# Patient Record
Sex: Male | Born: 2000 | Race: White | Hispanic: No | Marital: Married | Smoking: Never smoker
Health system: Southern US, Community
[De-identification: ages and names within clinical notes are randomized; demographics above are authoritative.]

## PROBLEM LIST (undated history)

## (undated) DIAGNOSIS — R1013 Epigastric pain: Secondary | ICD-10-CM

## (undated) DIAGNOSIS — H539 Unspecified visual disturbance: Secondary | ICD-10-CM

## (undated) DIAGNOSIS — F329 Major depressive disorder, single episode, unspecified: Secondary | ICD-10-CM

## (undated) DIAGNOSIS — J45909 Unspecified asthma, uncomplicated: Secondary | ICD-10-CM

## (undated) DIAGNOSIS — T7840XA Allergy, unspecified, initial encounter: Secondary | ICD-10-CM

## (undated) DIAGNOSIS — F32A Depression, unspecified: Secondary | ICD-10-CM

## (undated) DIAGNOSIS — F419 Anxiety disorder, unspecified: Secondary | ICD-10-CM

## (undated) DIAGNOSIS — K219 Gastro-esophageal reflux disease without esophagitis: Secondary | ICD-10-CM

## (undated) DIAGNOSIS — R109 Unspecified abdominal pain: Secondary | ICD-10-CM

## (undated) HISTORY — DX: Epigastric pain: R10.13

## (undated) HISTORY — PX: CHOLECYSTECTOMY: SHX55

## (undated) HISTORY — DX: Unspecified abdominal pain: R10.9

---
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
## 2020-05-17 ENCOUNTER — Ambulatory Visit (INDEPENDENT_AMBULATORY_CARE_PROVIDER_SITE_OTHER): Payer: Self-pay | Admitting: Pediatrics

## 2020-05-17 ENCOUNTER — Other Ambulatory Visit: Payer: Self-pay

## 2020-05-17 ENCOUNTER — Encounter: Payer: Self-pay | Admitting: Pediatrics

## 2020-05-17 VITALS — Wt 221.1 lb

## 2020-05-17 DIAGNOSIS — S40022A Contusion of left upper arm, initial encounter: Secondary | ICD-10-CM

## 2020-05-17 DIAGNOSIS — K219 Gastro-esophageal reflux disease without esophagitis: Secondary | ICD-10-CM

## 2020-05-17 MED ORDER — FAMOTIDINE 20 MG PO TABS
20.0000 mg | ORAL_TABLET | Freq: Every day | ORAL | 1 refills | Status: DC
Start: 2020-05-17 — End: 2024-02-14

## 2020-05-17 NOTE — Progress Notes (Signed)
Subjective:     Craig Pearson is a 20 y.o. male who presents for evaluation of a large bruise on the left forearm with a "knot" in the center and reflux. The bruise started approximately 1 week ago with little improvement. 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 He denies any numbness or tingling in his hand. He has also noticed that his reflux is getting worse, especially when he lays down after eating. He describes it as a burning pain in the back of his throat. He does drink soda daily and eats some spicy foods.  The following portions of the patient's history were reviewed and updated as appropriate: allergies, current medications, past family history, past medical history, past social history, past surgical history and problem list.  Review of Systems Pertinent items are noted in HPI.    Objective:    Wt 221 lb 1.6 oz (100.3 kg)   BMI 31.28 kg/m  General:  alert, cooperative, appears stated age and no distress  Skin:  hematomas noted on left foreamr     Assessment:    Hematoma of left forearm GERD  Plan:    Medications: Famatodine. Verbal and written  patient instruction given.   Follow up as needed

## 2020-05-17 NOTE — Patient Instructions (Signed)
Famotidine - 1 tablet daily for 14 days and then as needed Cool compress to bruise on arm Follow up as needed

## 2020-05-27 ENCOUNTER — Emergency Department (HOSPITAL_COMMUNITY)
Admission: EM | Admit: 2020-05-27 | Discharge: 2020-05-28 | Disposition: A | Discharge status: Home / Self Care | Payer: Medicaid Other | Attending: Emergency Medicine | Admitting: Emergency Medicine

## 2020-05-27 ENCOUNTER — Other Ambulatory Visit: Payer: Self-pay

## 2020-05-27 ENCOUNTER — Encounter (HOSPITAL_COMMUNITY): Payer: Self-pay | Admitting: Emergency Medicine

## 2020-05-27 ENCOUNTER — Emergency Department (HOSPITAL_COMMUNITY): Payer: Medicaid Other

## 2020-05-27 DIAGNOSIS — M542 Cervicalgia: Secondary | ICD-10-CM | POA: Insufficient documentation

## 2020-05-27 DIAGNOSIS — M545 Low back pain, unspecified: Secondary | ICD-10-CM | POA: Insufficient documentation

## 2020-05-27 DIAGNOSIS — Y92009 Unspecified place in unspecified non-institutional (private) residence as the place of occurrence of the external cause: Secondary | ICD-10-CM | POA: Insufficient documentation

## 2020-05-27 DIAGNOSIS — W010XXA Fall on same level from slipping, tripping and stumbling without subsequent striking against object, initial encounter: Secondary | ICD-10-CM | POA: Insufficient documentation

## 2020-05-27 DIAGNOSIS — Z5321 Procedure and treatment not carried out due to patient leaving prior to being seen by health care provider: Secondary | ICD-10-CM | POA: Insufficient documentation

## 2020-05-27 NOTE — ED Triage Notes (Signed)
Patient slipped and fell backwards at home this evening , no LOC/ambulatory , alert and oriented , respirations unlabored , reports pain at lower back and posterior neck pain .

## 2020-05-31 ENCOUNTER — Telehealth: Payer: Self-pay | Admitting: *Deleted

## 2020-06-02 NOTE — Telephone Encounter (Signed)
Duplicate

## 2020-08-12 ENCOUNTER — Ambulatory Visit: Payer: Self-pay

## 2020-08-12 ENCOUNTER — Encounter: Payer: Self-pay | Admitting: Pediatrics

## 2020-08-12 ENCOUNTER — Other Ambulatory Visit: Payer: Self-pay | Admitting: Pediatrics

## 2020-08-12 ENCOUNTER — Other Ambulatory Visit: Payer: Self-pay

## 2020-08-12 ENCOUNTER — Ambulatory Visit (INDEPENDENT_AMBULATORY_CARE_PROVIDER_SITE_OTHER): Payer: Self-pay | Admitting: Pediatrics

## 2020-08-12 VITALS — Temp 98.9°F | Wt 223.0 lb

## 2020-08-12 DIAGNOSIS — L0100 Impetigo, unspecified: Secondary | ICD-10-CM

## 2020-08-12 MED ORDER — DOXYCYCLINE MONOHYDRATE 100 MG PO TABS: 100.0000 mg | ORAL_TABLET | Freq: Two times a day (BID) | ORAL | 0 refills | Status: DC

## 2020-08-12 MED ORDER — DOXYCYCLINE HYCLATE 100 MG PO CAPS: 100.0000 mg | ORAL_CAPSULE | Freq: Two times a day (BID) | ORAL | 0 refills | Status: AC

## 2020-08-12 NOTE — Progress Notes (Signed)
Presents with tick bites to both legs for the past three days. No fever, no discharge, no swelling and no limitation of motion.   Review of Systems  Constitutional: Negative.  Negative for fever, activity change and appetite change.  HENT: Negative.  Negative for ear pain, congestion and rhinorrhea.   Eyes: Negative.   Respiratory: Negative.  Negative for cough and wheezing.   Cardiovascular: Negative.   Gastrointestinal: Negative.   Musculoskeletal: Negative.  Negative for myalgias, joint swelling and gait problem.  Neurological: Negative for numbness.  Hematological: Negative for adenopathy. Does not bruise/bleed easily.        Objective:   Physical Exam  Constitutional: He appears well-developed and well-nourished. He is active. No distress.  HENT:  Right Ear: Tympanic membrane normal.  Left Ear: Tympanic membrane normal.  Nose: No nasal discharge.  Mouth/Throat: Mucous membranes are moist. No tonsillar exudate. Oropharynx is clear. Pharynx is normal.  Eyes: Pupils are equal, round, and reactive to light.  Neck: Normal range of motion. No adenopathy.  Cardiovascular: Regular rhythm.  No murmur heard. Pulmonary/Chest: Effort normal. No respiratory distress. He exhibits no retraction.  Abdominal: Soft. Bowel sounds are normal. He exhibits no distension.  Musculoskeletal: He exhibits no edema and no deformity.  Neurological: He is alert.  Skin: Skin is warm. No petechiae and no rash noted.  Papular rash with scabs to inner thighs secondary to tick bites.      Assessment:     Impetigo secondary to bug bites    Plan:    Will treat with oral doxycycline and follow as needed

## 2020-08-12 NOTE — Patient Instructions (Signed)
Treatment of Skin Disease: Comprehensive Therapeutic Strategies (4th ed., pp. 332-334). Elsevier Limited. Retrieved from https://www.clinicalkey.com/#!/content/book/3-s2.0-B978070205235400106X?scrollTo=%23hl0000032">  Impetigo, Adult Impetigo is an infection of the skin. It commonly occurs in young children, but it can also occur in adults. The infection causes itchy blisters and sores that produce brownish-yellow fluid. As the fluid dries, it forms a thick, honey-colored crust. These skin changes usually occur on the face, but they can also affect other areas of the body. Impetigo usually goes away in 7-10 days with treatment. What are the causes? This condition is caused by two types of bacteria. It may be caused by staphylococci or streptococci bacteria. These bacteria cause impetigo when they get under the surface of the skin. This often happens after some damage to the skin, such as:  Cuts, scrapes, or scratches.  Rashes.  Insect bites, especially when you scratch the area of a bite.  Chickenpox or other illnesses that cause open skin sores.  Nail biting or chewing. Impetigo can spread easily from one person to another (is contagious). It may be spread through close skin contact or by sharing towels, clothing, or other items that an infected person has touched. Scratching the affected area can cause impetigo to spread to other parts of the body. The bacteria can get under your fingernails and spread when you touch another area of your skin. What increases the risk? The following factors may make you more likely to develop this condition:  Playing sports that include skin-to-skin contact with others.  Having broken skin, such as from a cut or scrape.  Living in an area that has high humidity levels.  Having poor hygiene.  Having high levels of staphylococci in your nose.  Having a condition that weakens the skin integrity, such as: ? Having a weak body defense system (immune  system). ? Having a skin condition with open sores, such as chickenpox. ? Having diabetes. What are the signs or symptoms? The main symptom of this condition is small blisters, often on the face around the mouth and nose. In time, the blisters break open and turn into tiny sores (lesions) with a yellow crust. In some cases, the blisters cause itching or burning. Scratching, irritation, or lack of treatment may cause these small lesions to get larger. Other possible symptoms include:  Larger blisters.  Pus.  Swollen lymph glands. How is this diagnosed? This condition is usually diagnosed during a physical exam. A skin sample or a sample of fluid from a blister may be taken for lab tests that involve growing bacteria (culture test). Lab tests can help confirm the diagnosis or help determine the best treatment. How is this treated? Treatment for this condition depends on the severity of the condition:  Mild impetigo can be treated with prescription antibiotic cream.  Oral antibiotic medicine may be used in more severe cases.  Medicines that reduce itchiness (antihistamines)may also be used. Follow these instructions at home: Medicines  Take over-the-counter and prescription medicines only as told by your health care provider.  Apply or take your antibiotic as told by your health care provider. Do not stop using the antibiotic even if your condition improves.  Before applying antibiotic cream or ointment, you should: ? Gently wash the infected areas with antibacterial soap and warm water. ? Soak crusted areas in warm, soapy water using antibacterial soap. ? Gently rub the areas to remove crusts. Do not scrub. Preventing the spread of infection  To help prevent impetigo from spreading to other body areas: ?   Keep your fingernails short and clean. ? Do not scratch the blisters or sores. ? Cover infected areas, if necessary, to keep from scratching. ? Wash your hands often with soap  and warm water.  To help prevent impetigo from spreading to other people: ? Do not share towels. ? Wash your clothing and bedsheets in water that is 140F (60C) or warmer. ? Stay home until you have used an antibiotic cream for 48 hours (2 days) or an oral antibiotic medicine for 24 hours (1 day).  You should only return to work and activities with other people if your skin shows significant improvement.  You may return to contact sports after you have used antibiotic medicine for 72 hours (3 days).   General instructions  Keep all follow-up visits. This is important. How is this prevented?  Wash your hands often with soap and warm water.  Do not share towels, washcloths, clothing, bedding, or razors.  Keep your fingernails short.  Keep any cuts, scrapes, bug bites, or rashes clean and covered.  Use insect repellent to prevent bug bites. Contact a health care provider if:  You develop more blisters or sores, even with treatment.  Other family members get sores.  Your skin sores are not improving after 72 hours (3 days) of treatment.  You have a fever. Get help right away if:  You see spreading redness or swelling of the skin around your sores.  You develop a sore throat.  The area around your rash becomes warm, red, or tender to the touch.  You have dark, reddish-brown urine.  You do not urinate often or you urinate small amounts.  You are very tired (lethargic).  You have swelling in your face, hands, or feet. Summary  Impetigo is a skin infection that causes itchy blisters and sores that produce brownish-yellow fluid. As the fluid dries, it forms a crust.  This condition is caused by staphylococci or streptococci bacteria. These bacteria cause impetigo when they get under the surface of the skin, such as through cuts, rashes, bug bites, or open sores.  Treatment for this condition may include antibiotic ointment or oral antibiotics.  To help prevent impetigo  from spreading to other body areas, make sure you keep your fingernails short, avoid scratching, cover any blisters, and wash your hands often.  If you have impetigo, stay home until you have used an antibiotic cream for 48 hours (2 days) or an oral antibiotic medicine for 24 hours (1 day). You should only return to work and activities with other people if your skin shows significant improvement. This information is not intended to replace advice given to you by your health care provider. Make sure you discuss any questions you have with your health care provider. Document Revised: 08/13/2019 Document Reviewed: 08/13/2019 Elsevier Patient Education  2021 Elsevier Inc.  

## 2021-01-25 ENCOUNTER — Encounter: Payer: Self-pay | Admitting: Pediatrics

## 2021-01-25 ENCOUNTER — Ambulatory Visit (INDEPENDENT_AMBULATORY_CARE_PROVIDER_SITE_OTHER): Payer: BC Managed Care – PPO | Admitting: Pediatrics

## 2021-01-25 ENCOUNTER — Other Ambulatory Visit: Payer: Self-pay

## 2021-01-25 VITALS — Wt 224.6 lb

## 2021-01-25 DIAGNOSIS — R110 Nausea: Secondary | ICD-10-CM | POA: Diagnosis not present

## 2021-01-25 MED ORDER — ONDANSETRON 8 MG PO TBDP: 8.0000 mg | ORAL_TABLET | Freq: Three times a day (TID) | ORAL | 0 refills | Status: DC | PRN

## 2021-01-25 NOTE — Progress Notes (Signed)
Subjective:     Craig Pearson is a 20 y.o. male who presents for evaluation of nausea. The nausea has been intermittent for the past 12 months. Over the past 3 to 4 months, the nausea has increased to daily. He has intermittent abdominal pain of both sides, below the ribs. He rates that pain a 6/10 at it's worst. Craig Pearson denies back pain and dysuria. He has not had any fevers.   The patient's history has been marked as reviewed and updated as appropriate.  Review of Systems Pertinent items are noted in HPI.     Objective:    Wt 224 lb 9.6 oz (101.9 kg)   BMI 31.33 kg/m  General appearance: alert, cooperative, appears stated age, and no distress Abdomen: soft, non-tender; bowel sounds normal; no masses,  no organomegaly    Assessment:   Chronic nausea   Plan:    See orders for lab and imaging studies. Adhere to simple, bland diet. Adhere to low fat diet. Further follow-up plans will be based on outcome of lab/imaging studies; see orders. Follow up as needed.

## 2021-01-25 NOTE — Patient Instructions (Signed)
Labs- go to [facility] Abdominal ultrasound Avoid foods that are greasy, heavy, acidic, spicey Limit caffeine

## 2021-01-26 LAB — CBC WITH DIFFERENTIAL/PLATELET
Absolute Monocytes: 621 cells/uL (ref 200–950)
Basophils Absolute: 48 cells/uL (ref 0–200)
Basophils Relative: 0.7 %
Eosinophils Absolute: 41 cells/uL (ref 15–500)
Eosinophils Relative: 0.6 %
HCT: 45.1 % (ref 38.5–50.0)
Hemoglobin: 15.4 g/dL (ref 13.2–17.1)
Lymphs Abs: 1339 cells/uL (ref 850–3900)
MCH: 29.3 pg (ref 27.0–33.0)
MCHC: 34.1 g/dL (ref 32.0–36.0)
MCV: 85.7 fL (ref 80.0–100.0)
MPV: 10.9 fL (ref 7.5–12.5)
Monocytes Relative: 9 %
Neutro Abs: 4851 cells/uL (ref 1500–7800)
Neutrophils Relative %: 70.3 %
Platelets: 234 10*3/uL (ref 140–400)
RBC: 5.26 10*6/uL (ref 4.20–5.80)
RDW: 12.4 % (ref 11.0–15.0)
Total Lymphocyte: 19.4 %
WBC: 6.9 10*3/uL (ref 3.8–10.8)

## 2021-01-26 LAB — COMPLETE METABOLIC PANEL WITH GFR
AG Ratio: 2.4 (calc) (ref 1.0–2.5)
ALT: 17 U/L (ref 8–46)
AST: 17 U/L (ref 12–32)
Albumin: 4.8 g/dL (ref 3.6–5.1)
Alkaline phosphatase (APISO): 77 U/L (ref 46–169)
BUN: 15 mg/dL (ref 7–20)
CO2: 30 mmol/L (ref 20–32)
Calcium: 10 mg/dL (ref 8.9–10.4)
Chloride: 101 mmol/L (ref 98–110)
Creat: 0.76 mg/dL (ref 0.60–1.24)
Globulin: 2 g/dL (calc) — ABNORMAL LOW (ref 2.1–3.5)
Glucose, Bld: 85 mg/dL (ref 65–99)
Potassium: 4 mmol/L (ref 3.8–5.1)
Sodium: 141 mmol/L (ref 135–146)
Total Bilirubin: 0.5 mg/dL (ref 0.2–1.1)
Total Protein: 6.8 g/dL (ref 6.3–8.2)
eGFR: 133 mL/min/{1.73_m2} (ref >=60)

## 2021-01-26 LAB — CELIAC DISEASE PANEL
(tTG) Ab, IgA: <1 U/mL
(tTG) Ab, IgG: <1 U/mL
Gliadin IgA: <1 U/mL
Gliadin IgG: <1 U/mL
Immunoglobulin A: 51 mg/dL (ref 47–310)

## 2021-01-26 LAB — LIPASE: Lipase: 25 U/L (ref 7–60)

## 2021-01-26 LAB — C-REACTIVE PROTEIN: CRP: 1.9 mg/L (ref <8.0)

## 2021-01-26 LAB — AMYLASE: Amylase: 59 U/L (ref 21–101)

## 2021-01-27 ENCOUNTER — Ambulatory Visit
Admission: RE | Admit: 2021-01-27 | Discharge: 2021-01-27 | Disposition: A | Discharge status: Home / Self Care | Payer: BC Managed Care – PPO | Source: Ambulatory Visit | Attending: Pediatrics | Admitting: Pediatrics

## 2021-01-27 DIAGNOSIS — R110 Nausea: Secondary | ICD-10-CM

## 2021-02-19 ENCOUNTER — Emergency Department (HOSPITAL_COMMUNITY)
Admission: EM | Admit: 2021-02-19 | Discharge: 2021-02-19 | Disposition: A | Discharge status: Home / Self Care | Payer: BC Managed Care – PPO | Attending: Emergency Medicine | Admitting: Emergency Medicine

## 2021-02-19 ENCOUNTER — Other Ambulatory Visit: Payer: Self-pay

## 2021-02-19 ENCOUNTER — Ambulatory Visit: Admit: 2021-02-19 | Discharge status: Against Medical Advice | Payer: 59

## 2021-02-19 ENCOUNTER — Encounter (HOSPITAL_COMMUNITY): Payer: Self-pay | Admitting: Emergency Medicine

## 2021-02-19 DIAGNOSIS — Z79899 Other long term (current) drug therapy: Secondary | ICD-10-CM | POA: Insufficient documentation

## 2021-02-19 DIAGNOSIS — Z7722 Contact with and (suspected) exposure to environmental tobacco smoke (acute) (chronic): Secondary | ICD-10-CM | POA: Insufficient documentation

## 2021-02-19 DIAGNOSIS — J101 Influenza due to other identified influenza virus with other respiratory manifestations: Secondary | ICD-10-CM | POA: Insufficient documentation

## 2021-02-19 DIAGNOSIS — Z20822 Contact with and (suspected) exposure to covid-19: Secondary | ICD-10-CM | POA: Insufficient documentation

## 2021-02-19 LAB — RESP PANEL BY RT-PCR (FLU A&B, COVID) ARPGX2
Influenza A by PCR: POSITIVE — AB
Influenza B by PCR: NEGATIVE
SARS Coronavirus 2 by RT PCR: NEGATIVE

## 2021-02-19 LAB — GROUP A STREP BY PCR: Group A Strep by PCR: NOT DETECTED

## 2021-02-19 MED ORDER — ONDANSETRON HCL 4 MG PO TABS: 4.0000 mg | ORAL_TABLET | Freq: Three times a day (TID) | ORAL | 0 refills | Status: DC | PRN

## 2021-02-19 MED ORDER — KETOROLAC TROMETHAMINE 15 MG/ML IJ SOLN
15.0000 mg | Freq: Once | INTRAMUSCULAR | Status: AC
  Administered 2021-02-19: 15 mg via INTRAVENOUS
  Filled 2021-02-19: qty 1

## 2021-02-19 MED ORDER — ONDANSETRON HCL 4 MG/2ML IJ SOLN: 4.0000 mg | Freq: Once | INTRAMUSCULAR | Status: AC

## 2021-02-19 MED ORDER — SODIUM CHLORIDE 0.9 % IV BOLUS
1000.0000 mL | Freq: Once | INTRAVENOUS | Status: AC
  Administered 2021-02-19: 1000 mL via INTRAVENOUS

## 2021-02-19 MED ORDER — ONDANSETRON HCL 4 MG/2ML IJ SOLN
INTRAMUSCULAR | Status: AC
  Administered 2021-02-19: 4 mg via INTRAVENOUS
  Filled 2021-02-19: qty 2

## 2021-02-19 MED ORDER — ACETAMINOPHEN 500 MG PO TABS: 500.0000 mg | ORAL_TABLET | Freq: Four times a day (QID) | ORAL | 0 refills | Status: DC | PRN

## 2021-02-19 MED ORDER — OSELTAMIVIR PHOSPHATE 75 MG PO CAPS: 75.0000 mg | ORAL_CAPSULE | Freq: Two times a day (BID) | ORAL | 0 refills | Status: DC

## 2021-02-19 NOTE — ED Provider Notes (Signed)
[facility] EMERGENCY DEPARTMENT Provider Note   CSN: [id number] Arrival date & time: 02/19/21  0539     History Chief Complaint  Patient presents with   Shortness of Breath   Cough   Sore Throat    Craig Pearson is a 20 y.o. male.  The history is provided by the patient and medical records. No language interpreter was used.  Shortness of Breath Associated symptoms: cough   Cough Associated symptoms: shortness of breath   Sore Throat Associated symptoms include shortness of breath.   20 year old male presenting with complaints of cold symptoms.  Patient reports waking up this morning having cough productive with white phlegm, sore throat, and also endorsed subjective fever.  Does endorse some shortness of breath with coughing.  Now endorsing congestion.  Does endorse body aches but feels similar to prior body aches that he has regularly from work.  He denies any specific treatment tried and denies any recent sick contact.  Aches and pain rates a 7 out of 10.  Remote history of childhood asthma.  Past Medical History:  Diagnosis Date   Abdominal pain    Allergy    seasonal    Anxiety    Constipation 04/18/2011   Depression    Epigastric abdominal pain 04/18/2011   Vision abnormalities    wears glasses    Patient Active Problem List   Diagnosis Date Noted   Encounter for routine child health examination without abnormal findings 11/16/2016    Past Surgical History:  Procedure Laterality Date   MASS EXCISION Right 07/26/2016   Procedure: RIGHT WRIST GANGLION EXCISION;  Surgeon: Frederico Hamman, MD;  Location: [facility];  Service: Orthopedics;  Laterality: Right;       Family History  Problem Relation Age of Onset   GER disease Father    Arthritis Maternal Grandmother    Diabetes Maternal Grandmother    Hearing loss Maternal Grandmother    Hypertension Maternal Grandmother    Kidney disease Maternal Grandmother    Cancer Maternal  Grandfather    Arthritis Paternal Grandmother    Heart disease Paternal Grandfather    Hirschsprung's disease Neg Hx     Social History   Tobacco Use   Smoking status: Never    Passive exposure: Yes   Smokeless tobacco: Never   Tobacco comments:    Mother smokes outside   Vaping Use   Vaping Use: Never used  Substance Use Topics   Alcohol use: No   Drug use: No    Home Medications Prior to Admission medications   Medication Sig Start Date End Date Taking? Authorizing Provider  cetirizine (ZYRTEC) 10 MG tablet Take 1 tablet (10 mg total) by mouth daily. 12/03/17 01/03/18  Georgiann Hahn, MD  escitalopram (LEXAPRO) 5 MG tablet Take 5 mg by mouth daily. 12/28/16   [provider]  escitalopram (LEXAPRO) 5 MG tablet Take by mouth. 12/28/16   [provider]  famotidine (PEPCID) 20 MG tablet Take 1 tablet (20 mg total) by mouth daily. 05/17/20 06/16/20  Estelle June, NP  fluticasone (FLONASE) 50 MCG/ACT nasal spray Place 2 sprays into both nostrils daily. 12/03/17   Georgiann Hahn, MD  HYDROcodone-acetaminophen (NORCO/VICODIN) 5-325 MG tablet Take 1 tablet by mouth every 4 (four) hours as needed. 10/22/17   Niel Hummer, MD  montelukast (SINGULAIR) 10 MG tablet Take 1 tablet (10 mg total) by mouth at bedtime. 12/03/17   Georgiann Hahn, MD  omeprazole (PRILOSEC) 20 MG capsule Take  1 capsule (20 mg total) by mouth daily for 14 days. 03/09/18 03/23/18  Estelle June, NP  ondansetron (ZOFRAN ODT) 8 MG disintegrating tablet Take 1 tablet (8 mg total) by mouth every 8 (eight) hours as needed for nausea or vomiting. 01/25/21   Klett, Pascal Lux, NP    Allergies    Patient has no known allergies.  Review of Systems   Review of Systems  Respiratory:  Positive for cough and shortness of breath.   All other systems reviewed and are negative.  Physical Exam Updated Vital Signs BP (!) 141/87 (BP Location: Right Arm)   Pulse (!) 103   Temp 98.5 F (36.9 C) (Oral)   Resp 18    SpO2 96%   Physical Exam Vitals and nursing note reviewed.  Constitutional:      General: He is not in acute distress.    Appearance: He is well-developed.  HENT:     Head: Atraumatic.     Mouth/Throat:     Mouth: Mucous membranes are moist.     Pharynx: Oropharynx is clear. No pharyngeal swelling or oropharyngeal exudate.  Eyes:     Conjunctiva/sclera: Conjunctivae normal.  Cardiovascular:     Rate and Rhythm: Normal rate and regular rhythm.  Pulmonary:     Effort: Pulmonary effort is normal.     Breath sounds: Normal breath sounds. No wheezing, rhonchi or rales.  Abdominal:     Palpations: Abdomen is soft.  Musculoskeletal:     Cervical back: Neck supple.  Skin:    Findings: No rash.  Neurological:     Mental Status: He is alert.    ED Results / Procedures / Treatments   Labs (all labs ordered are listed, but only abnormal results are displayed) Labs Reviewed  RESP PANEL BY RT-PCR (FLU A&B, COVID) ARPGX2 - Abnormal; Notable for the following components:      Result Value   Influenza A by PCR POSITIVE (*)    All other components within normal limits  GROUP A STREP BY PCR    EKG None ED ECG REPORT   Date: 02/19/2021  Rate: 122  Rhythm: sinus tachycardia  QRS Axis: normal  Intervals: normal  ST/T Wave abnormalities: normal  Conduction Disutrbances:none  Narrative Interpretation:   Old EKG Reviewed: none available  I have personally reviewed the EKG tracing and agree with the computerized printout as noted.   Radiology No results found.  Procedures Procedures   Medications Ordered in ED Medications  ketorolac (TORADOL) 15 MG/ML injection 15 mg (15 mg Intravenous Given 02/19/21 1112)  ondansetron (ZOFRAN) injection 4 mg (4 mg Intravenous Given 02/19/21 1110)  sodium chloride 0.9 % bolus 1,000 mL (0 mLs Intravenous Stopped 02/19/21 1157)    ED Course  I have reviewed the triage vital signs and the nursing notes.  Pertinent labs & imaging results  that were available during my care of the patient were reviewed by me and considered in my medical decision making (see chart for details).    MDM Rules/Calculators/A&P                           BP 119/67   Pulse 100   Temp 98.5 F (36.9 C) (Oral)   Resp 20   SpO2 98%   Final Clinical Impression(s) / ED Diagnoses Final diagnoses:  Influenza A    Rx / DC Orders ED Discharge Orders          Ordered  ondansetron (ZOFRAN) 4 MG tablet  Every 8 hours PRN        02/19/21 1158    acetaminophen (TYLENOL) 500 MG tablet  Every 6 hours PRN        02/19/21 1158    oseltamivir (TAMIFLU) 75 MG capsule  Every 12 hours        02/19/21 1159           9:43 AM Patient here with cold symptoms that started early this morning.  Symptoms not consistent with pneumonia, or PE.  Patient overall well-appearing.  Lungs clear to auscultation.  Throat exam unremarkable.  Viral respiratory panel as well as strep test obtained  Initial EKG shows sinus tachycardia.  Patient given IV fluid and antinausea medication as well as Toradol.  12:00 PM Fortunately with IV fluid, tachycardia did improve.  Patient test positive for influenza A.  He is within the window to be treated with Tamiflu, will prescribe.  Work note provided.   Fayrene Helper, PA-C 02/19/21 1201    Melene Plan, DO 02/19/21 1241

## 2021-02-19 NOTE — Discharge Instructions (Addendum)
You have tested positive for influenza A.  Please get plenty of rest, drink plenty of fluid, take medication prescribed as needed.  Return if you have any concern.  Take Tamiflu as treatment for your flu.

## 2021-02-19 NOTE — ED Triage Notes (Signed)
Woke up at 4am with sore throat, SOB, and non-productive cough.  Went back to sleep and woke up with a white productive cough.  States sore throat is better but still SOB.

## 2021-03-03 ENCOUNTER — Emergency Department (HOSPITAL_COMMUNITY)
Admission: EM | Admit: 2021-03-03 | Discharge: 2021-03-03 | Disposition: A | Discharge status: Home / Self Care | Payer: No Typology Code available for payment source | Attending: Emergency Medicine | Admitting: Emergency Medicine

## 2021-03-03 ENCOUNTER — Emergency Department (HOSPITAL_COMMUNITY): Payer: No Typology Code available for payment source

## 2021-03-03 ENCOUNTER — Other Ambulatory Visit: Payer: Self-pay

## 2021-03-03 ENCOUNTER — Encounter (HOSPITAL_COMMUNITY): Payer: Self-pay | Admitting: *Deleted

## 2021-03-03 DIAGNOSIS — S161XXA Strain of muscle, fascia and tendon at neck level, initial encounter: Secondary | ICD-10-CM | POA: Insufficient documentation

## 2021-03-03 DIAGNOSIS — Y92410 Unspecified street and highway as the place of occurrence of the external cause: Secondary | ICD-10-CM | POA: Diagnosis not present

## 2021-03-03 DIAGNOSIS — S169XXA Unspecified injury of muscle, fascia and tendon at neck level, initial encounter: Secondary | ICD-10-CM | POA: Diagnosis present

## 2021-03-03 DIAGNOSIS — Z7722 Contact with and (suspected) exposure to environmental tobacco smoke (acute) (chronic): Secondary | ICD-10-CM | POA: Insufficient documentation

## 2021-03-03 DIAGNOSIS — H538 Other visual disturbances: Secondary | ICD-10-CM | POA: Diagnosis not present

## 2021-03-03 DIAGNOSIS — M545 Low back pain, unspecified: Secondary | ICD-10-CM | POA: Insufficient documentation
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
## 2021-03-03 NOTE — Discharge Instructions (Signed)
Take ibuprofen 3 times a day with meals.  Do not take other anti-inflammatories at the same time (Advil, Motrin, naproxen, Aleve). You may supplement with Tylenol if you need further pain control. Use muscle creams or patches such as salonpas, icy hot, Bengay, Biofreeze to help with your pain Use ice packs or heating pads if this helps control your pain. You will likely have continued muscle stiffness and soreness over the next couple days.  Follow-up with primary care in 1 week if your symptoms are not improving. Return to the emergency room if you develop vision changes, vomiting, slurred speech, numbness, loss of bowel or bladder control, or any new or worsening symptoms.
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
## 2021-11-07 ENCOUNTER — Encounter: Payer: Self-pay | Admitting: Pediatrics

## 2022-01-02 ENCOUNTER — Ambulatory Visit
Admission: RE | Admit: 2022-01-02 | Discharge: 2022-01-02 | Disposition: A | Discharge status: Home / Self Care | Payer: Medicaid Other | Source: Ambulatory Visit | Attending: Urgent Care | Admitting: Urgent Care

## 2022-01-02 VITALS — BP 119/78 | HR 63 | Temp 98.1°F | Resp 17

## 2022-01-02 DIAGNOSIS — Z1152 Encounter for screening for COVID-19: Secondary | ICD-10-CM | POA: Insufficient documentation

## 2022-01-02 DIAGNOSIS — J069 Acute upper respiratory infection, unspecified: Secondary | ICD-10-CM | POA: Insufficient documentation

## 2022-01-02 DIAGNOSIS — R059 Cough, unspecified: Secondary | ICD-10-CM | POA: Insufficient documentation

## 2022-01-02 DIAGNOSIS — J019 Acute sinusitis, unspecified: Secondary | ICD-10-CM | POA: Insufficient documentation

## 2022-01-02 LAB — RESP PANEL BY RT-PCR (RSV, FLU A&B, COVID)  RVPGX2
Influenza A by PCR: NEGATIVE
Influenza B by PCR: NEGATIVE
Resp Syncytial Virus by PCR: NEGATIVE
SARS Coronavirus 2 by RT PCR: NEGATIVE

## 2022-01-02 LAB — POCT RAPID STREP A (OFFICE): Rapid Strep A Screen: NEGATIVE

## 2022-01-02 NOTE — Discharge Instructions (Addendum)
Follow up here or with your primary care provider if your symptoms are worsening or not improving.     

## 2022-01-02 NOTE — ED Provider Notes (Signed)
[name]    CSN: [id number] Arrival date & time: 01/02/22  1457      History   Chief Complaint Chief Complaint  Patient presents with   Cough    Sore throat and cough. Congestion in chest - Entered by patient   Nasal Congestion   Sore Throat    HPI Craig Pearson is a 21 y.o. male.    Cough Sore Throat   Presents to UC with complaint of sore throat, nasal congestion and cough x3 to 4 days.  He states his symptoms are getting a little worse in the last day.  Denies fever.  Denies shortness of breath.  Denies chills and myalgias.  Past Medical History:  Diagnosis Date   Abdominal pain    Allergy    seasonal    Anxiety    Constipation 04/18/2011   Depression    Epigastric abdominal pain 04/18/2011   Vision abnormalities    wears glasses    Patient Active Problem List   Diagnosis Date Noted   Encounter for routine child health examination without abnormal findings 11/16/2016    Past Surgical History:  Procedure Laterality Date   MASS EXCISION Right 07/26/2016   Procedure: RIGHT WRIST GANGLION EXCISION;  Surgeon: Frederico Hamman, MD;  Location: [facility];  Service: Orthopedics;  Laterality: Right;       Home Medications    Prior to Admission medications   Medication Sig Start Date End Date Taking? Authorizing Provider  acetaminophen (TYLENOL) 500 MG tablet Take 1 tablet (500 mg total) by mouth every 6 (six) hours as needed. 02/19/21   Fayrene Helper, PA-C  cetirizine (ZYRTEC) 10 MG tablet Take 1 tablet (10 mg total) by mouth daily. 12/03/17 01/03/18  Georgiann Hahn, MD  escitalopram (LEXAPRO) 5 MG tablet Take 5 mg by mouth daily. 12/28/16   [provider]  escitalopram (LEXAPRO) 5 MG tablet Take by mouth. 12/28/16   [provider]  famotidine (PEPCID) 20 MG tablet Take 1 tablet (20 mg total) by mouth daily. 05/17/20 06/16/20  Estelle June, NP  fluticasone (FLONASE) 50 MCG/ACT nasal spray Place 2 sprays into both nostrils  daily. 12/03/17   Georgiann Hahn, MD  HYDROcodone-acetaminophen (NORCO/VICODIN) 5-325 MG tablet Take 1 tablet by mouth every 4 (four) hours as needed. 10/22/17   Niel Hummer, MD  montelukast (SINGULAIR) 10 MG tablet Take 1 tablet (10 mg total) by mouth at bedtime. 12/03/17   Georgiann Hahn, MD  omeprazole (PRILOSEC) 20 MG capsule Take 1 capsule (20 mg total) by mouth daily for 14 days. 03/09/18 03/23/18  Estelle June, NP  ondansetron (ZOFRAN ODT) 8 MG disintegrating tablet Take 1 tablet (8 mg total) by mouth every 8 (eight) hours as needed for nausea or vomiting. 01/25/21   Klett, Pascal Lux, NP  ondansetron (ZOFRAN) 4 MG tablet Take 1 tablet (4 mg total) by mouth every 8 (eight) hours as needed for nausea or vomiting. 02/19/21   Fayrene Helper, PA-C  oseltamivir (TAMIFLU) 75 MG capsule Take 1 capsule (75 mg total) by mouth every 12 (twelve) hours. 02/19/21   Fayrene Helper, PA-C    Family History Family History  Problem Relation Age of Onset   GER disease Father    Arthritis Maternal Grandmother    Diabetes Maternal Grandmother    Hearing loss Maternal Grandmother    Hypertension Maternal Grandmother    Kidney disease Maternal Grandmother    Cancer Maternal Grandfather    Arthritis Paternal Grandmother  Heart disease Paternal Grandfather    Hirschsprung's disease Neg Hx     Social History Social History   Tobacco Use   Smoking status: Never    Passive exposure: Yes   Smokeless tobacco: Never   Tobacco comments:    Mother smokes outside   Vaping Use   Vaping Use: Never used  Substance Use Topics   Alcohol use: No   Drug use: No     Allergies   Patient has no known allergies.   Review of Systems Review of Systems  Respiratory:  Positive for cough.     Physical Exam Triage Vital Signs ED Triage Vitals  Enc Vitals Group     BP      Pulse      Resp      Temp      Temp src      SpO2      Weight      Height      Head Circumference      Peak Flow      Pain Score       Pain Loc      Pain Edu?      Excl. in GC?    No data found.  Updated Vital Signs There were no vitals taken for this visit.  Visual Acuity Right Eye Distance:   Left Eye Distance:   Bilateral Distance:    Right Eye Near:   Left Eye Near:    Bilateral Near:     Physical Exam Vitals reviewed.  Constitutional:      Appearance: He is well-developed.  HENT:     Head: Normocephalic and atraumatic.     Nose: Congestion present.     Mouth/Throat:     Mouth: Mucous membranes are moist.     Pharynx: Posterior oropharyngeal erythema present. No oropharyngeal exudate.  Cardiovascular:     Rate and Rhythm: Normal rate and regular rhythm.  Pulmonary:     Effort: Pulmonary effort is normal.     Breath sounds: Normal breath sounds.  Skin:    General: Skin is warm and dry.  Neurological:     General: No focal deficit present.     Mental Status: He is alert and oriented to person, place, and time.  Psychiatric:        Mood and Affect: Mood normal.        Behavior: Behavior normal.      UC Treatments / Results  Labs (all labs ordered are listed, but only abnormal results are displayed) Labs Reviewed - No data to display  EKG   Radiology No results found.  Procedures Procedures (including critical care time)  Medications Ordered in UC Medications - No data to display  Initial Impression / Assessment and Plan / UC Course  I have reviewed the triage vital signs and the nursing notes.  Pertinent labs & imaging results that were available during my care of the patient were reviewed by me and considered in my medical decision making (see chart for details).   Rapid strep is negative.  Suspect viral etiology for his symptoms.  Respiratory swab obtained and pending however symptoms greater than 5 days so no treatment would be possible.  Recommend continued treatment with OTC medication for symptom control.   Final Clinical Impressions(s) / UC Diagnoses   Final  diagnoses:  None   Discharge Instructions   None    ED Prescriptions   None    PDMP not reviewed this encounter.  Rose Phi, [place] 01/02/22 1557

## 2022-01-02 NOTE — ED Triage Notes (Signed)
Pt. Presents w/ sore throat, nasal congestion and a cough for the past 3 days. Pt. Has taken OTC medication w/ no relief.

## 2022-01-05 ENCOUNTER — Ambulatory Visit
Admission: RE | Admit: 2022-01-05 | Discharge: 2022-01-05 | Disposition: A | Discharge status: Home / Self Care | Payer: Medicaid Other | Source: Ambulatory Visit | Attending: Emergency Medicine | Admitting: Emergency Medicine

## 2022-01-05 VITALS — BP 133/84 | HR 66 | Temp 97.9°F | Resp 18

## 2022-01-05 DIAGNOSIS — J309 Allergic rhinitis, unspecified: Secondary | ICD-10-CM

## 2022-01-05 DIAGNOSIS — R053 Chronic cough: Secondary | ICD-10-CM

## 2022-01-05 MED ORDER — FLUTICASONE PROPIONATE 50 MCG/ACT NA SUSP
1.0000 | Freq: Every day | NASAL | 2 refills | Status: AC
Start: 2022-01-05 — End: ?

## 2022-01-05 MED ORDER — CETIRIZINE HCL 10 MG PO TABS: 10.0000 mg | ORAL_TABLET | Freq: Every day | ORAL | 1 refills | Status: AC

## 2022-01-05 MED ORDER — MONTELUKAST SODIUM 10 MG PO TABS: 10.0000 mg | ORAL_TABLET | Freq: Every day | ORAL | 2 refills | Status: AC

## 2022-01-05 NOTE — ED Provider Notes (Signed)
[facility]    CSN: [id number] Arrival date & time: 01/05/22  1414    HISTORY   Chief Complaint  Patient presents with   Cough    Dry/wet cough. Dark mucus and sometimes bloody. Wheezing - Entered by patient   HPI Craig Pearson is a pleasant, 21 y.o. male who presents to urgent care today. Pt c/o cough that has gotten worse x about 6 days. The patient states he has been taking OTC cold/ flu meds.  Patient has normal vital signs on arrival today.  The history is provided by the patient.  Cough Cough characteristics:  Productive Sputum characteristics:  Nondescript Severity:  Moderate Onset quality:  Sudden Duration:  6 days Timing:  Intermittent Progression:  Waxing and waning Chronicity:  New Smoker: no   Context: upper respiratory infection   Relieved by:  Nothing Worsened by:  Deep breathing, activity and lying down Ineffective treatments:  Decongestant and cough suppressants Associated symptoms: sinus congestion   Associated symptoms: no chest pain, no chills, no diaphoresis, no ear fullness, no ear pain, no eye discharge, no fever, no headaches, no myalgias, no rash, no rhinorrhea, no shortness of breath, no sore throat, no weight loss and no wheezing   Risk factors: recent infection   Risk factors comment:  Hx allergies and asthma, not currently taking meds for either  Past Medical History:  Diagnosis Date   Abdominal pain    Allergy    seasonal    Anxiety    Constipation 04/18/2011   Depression    Epigastric abdominal pain 04/18/2011   Vision abnormalities    wears glasses   Patient Active Problem List   Diagnosis Date Noted   Encounter for routine child health examination without abnormal findings 11/16/2016   Past Surgical History:  Procedure Laterality Date   MASS EXCISION Right 07/26/2016   Procedure: RIGHT WRIST GANGLION EXCISION;  Surgeon: Earlie Server, MD;  Location: [facility];  Service: Orthopedics;  Laterality: Right;     Home Medications    Prior to Admission medications   Medication Sig Start Date End Date Taking? Authorizing Provider  famotidine (PEPCID) 20 MG tablet Take 1 tablet (20 mg total) by mouth daily. 05/17/20 06/16/20  Leveda Anna, NP    Family History Family History  Problem Relation Age of Onset   GER disease Father    Arthritis Maternal Grandmother    Diabetes Maternal Grandmother    Hearing loss Maternal Grandmother    Hypertension Maternal Grandmother    Kidney disease Maternal Grandmother    Cancer Maternal Grandfather    Arthritis Paternal Grandmother    Heart disease Paternal Grandfather    Hirschsprung's disease Neg Hx    Social History Social History   Tobacco Use   Smoking status: Never    Passive exposure: Yes   Smokeless tobacco: Never   Tobacco comments:    Mother smokes outside   Vaping Use   Vaping Use: Never used  Substance Use Topics   Alcohol use: No   Drug use: No   Allergies   Patient has no known allergies.  Review of Systems Review of Systems  Constitutional:  Negative for chills, diaphoresis, fever and weight loss.  HENT:  Negative for ear pain, rhinorrhea and sore throat.   Eyes:  Negative for discharge.  Respiratory:  Positive for cough. Negative for shortness of breath and wheezing.   Cardiovascular:  Negative for chest pain.  Musculoskeletal:  Negative for myalgias.  Skin:  Negative for rash.  Neurological:  Negative for headaches.   Pertinent findings revealed after performing a 14 point review of systems has been noted in the history of present illness.  Physical Exam Triage Vital Signs ED Triage Vitals  Enc Vitals Group     BP 01/21/21 0827 (!) 147/82     Pulse Rate 01/21/21 0827 72     Resp 01/21/21 0827 18     Temp 01/21/21 0827 98.3 F (36.8 C)     Temp Source 01/21/21 0827 Oral     SpO2 01/21/21 0827 98 %     Weight --      Height --      Head Circumference --      Peak Flow --      Pain Score 01/21/21 0826 5      Pain Loc --      Pain Edu? --      Excl. in [place]? --   No data found.  Updated Vital Signs BP 133/84 (BP Location: Right Arm)   Pulse 66   Temp 97.9 F (36.6 C) (Oral)   Resp 18   SpO2 97%   Physical Exam Vitals and nursing note reviewed.  Constitutional:      General: He is not in acute distress.    Appearance: Normal appearance. He is not ill-appearing.  HENT:     Head: Normocephalic and atraumatic.     Salivary Glands: Right salivary gland is not diffusely enlarged or tender. Left salivary gland is not diffusely enlarged or tender.     Right Ear: Ear canal and external ear normal. No drainage. A middle ear effusion is present. There is no impacted cerumen. Tympanic membrane is bulging. Tympanic membrane is not injected or erythematous.     Left Ear: Ear canal and external ear normal. No drainage. A middle ear effusion is present. There is no impacted cerumen. Tympanic membrane is bulging. Tympanic membrane is not injected or erythematous.     Ears:     Comments: Bilateral EACs normal, both TMs bulging with clear fluid    Nose: Rhinorrhea present. No nasal deformity, septal deviation, signs of injury, nasal tenderness, mucosal edema or congestion. Rhinorrhea is clear.     Right Nostril: Occlusion present. No foreign body, epistaxis or septal hematoma.     Left Nostril: Occlusion present. No foreign body, epistaxis or septal hematoma.     Right Turbinates: Enlarged, swollen and pale.     Left Turbinates: Enlarged, swollen and pale.     Right Sinus: No maxillary sinus tenderness or frontal sinus tenderness.     Left Sinus: No maxillary sinus tenderness or frontal sinus tenderness.     Mouth/Throat:     Lips: Pink. No lesions.     Mouth: Mucous membranes are moist. No oral lesions.     Pharynx: Oropharynx is clear. Uvula midline. No posterior oropharyngeal erythema or uvula swelling.     Tonsils: No tonsillar exudate. 0 on the right. 0 on the left.     Comments: Postnasal  drip Eyes:     General: Lids are normal.        Right eye: No discharge.        Left eye: No discharge.     Extraocular Movements: Extraocular movements intact.     Conjunctiva/sclera: Conjunctivae normal.     Right eye: Right conjunctiva is not injected.     Left eye: Left conjunctiva is not injected.  Neck:     Trachea: Trachea  and phonation normal.  Cardiovascular:     Rate and Rhythm: Normal rate and regular rhythm.     Pulses: Normal pulses.     Heart sounds: Normal heart sounds. No murmur heard.    No friction rub. No gallop.  Pulmonary:     Effort: Pulmonary effort is normal. No accessory muscle usage, prolonged expiration or respiratory distress.     Breath sounds: Normal breath sounds. No stridor, decreased air movement or transmitted upper airway sounds. No decreased breath sounds, wheezing, rhonchi or rales.  Chest:     Chest wall: No tenderness.  Musculoskeletal:        General: Normal range of motion.     Cervical back: Normal range of motion and neck supple. Normal range of motion.  Lymphadenopathy:     Cervical: No cervical adenopathy.  Skin:    General: Skin is warm and dry.     Findings: No erythema or rash.  Neurological:     General: No focal deficit present.     Mental Status: He is alert and oriented to person, place, and time.  Psychiatric:        Mood and Affect: Mood normal.        Behavior: Behavior normal.     Visual Acuity Right Eye Distance:   Left Eye Distance:   Bilateral Distance:    Right Eye Near:   Left Eye Near:    Bilateral Near:     UC Couse / Diagnostics / Procedures:     Radiology No results found.  Procedures Procedures (including critical care time) EKG  Pending results:  Labs Reviewed - No data to display  Medications Ordered in UC: Medications - No data to display  UC Diagnoses / Final Clinical Impressions(s)   I have reviewed the triage vital signs and the nursing notes.  Pertinent labs & imaging results  that were available during my care of the patient were reviewed by me and considered in my medical decision making (see chart for details).    Final diagnoses:  Allergic rhinitis, unspecified seasonality, unspecified trigger  Allergic rhinitis with postnasal drip  Persistent cough   Physical exam not concerning for respiratory infection at this time.  Physical exam is concerning for uncontrolled allergies.  Patient provided with a renewal of his allergy and asthma medications as previously prescribed.  Patient advised to take them daily not "as needed".  Return precautions advised.  ED Prescriptions     Medication Sig Dispense Auth. Provider   montelukast (SINGULAIR) 10 MG tablet Take 1 tablet (10 mg total) by mouth at bedtime. 30 tablet Lynden [place] Scales, PA-C   cetirizine (ZYRTEC ALLERGY) 10 MG tablet Take 1 tablet (10 mg total) by mouth at bedtime. 90 tablet Lynden [place] Scales, PA-C   fluticasone (FLONASE) 50 MCG/ACT nasal spray Place 1 spray into both nostrils daily. Begin by using 2 sprays in each nare daily for 3 to 5 days, then decrease to 1 spray in each nare daily. 15.8 mL Lynden [place] Scales, PA-C      PDMP not reviewed this encounter.  Disposition Upon Discharge:  Condition: stable for discharge home Home: take medications as prescribed; routine discharge instructions as discussed; follow up as advised.  Patient presented with an acute illness with associated systemic symptoms and significant discomfort requiring urgent management. In my opinion, this is a condition that a prudent lay person (someone who possesses an average knowledge of health and medicine) may potentially expect to result in complications if  not addressed urgently such as respiratory distress, impairment of bodily function or dysfunction of bodily organs.   Routine symptom specific, illness specific and/or disease specific instructions were discussed with the patient and/or caregiver at length.    As such, the patient has been evaluated and assessed, work-up was performed and treatment was provided in alignment with urgent care protocols and evidence based medicine.  Patient/parent/caregiver has been advised that the patient may require follow up for further testing and treatment if the symptoms continue in spite of treatment, as clinically indicated and appropriate.  If the patient was tested for COVID-19, Influenza and/or RSV, then the patient/parent/guardian was advised to isolate at home pending the results of his/her diagnostic coronavirus test and potentially longer if they're positive. I have also advised pt that if his/her COVID-19 test returns positive, it's recommended to self-isolate for at least 10 days after symptoms first appeared AND until fever-free for 24 hours without fever reducer AND other symptoms have improved or resolved. Discussed self-isolation recommendations as well as instructions for household member/close contacts as per the [facility] and [facility], and also gave patient the [place] packet with this information.  Patient/parent/caregiver has been advised to return to the [facility] or PCP in 3-5 days if no better; to PCP or the Emergency Department if new signs and symptoms develop, or if the current signs or symptoms continue to change or worsen for further workup, evaluation and treatment as clinically indicated and appropriate  The patient will follow up with their current PCP if and as advised. If the patient does not currently have a PCP we will assist them in obtaining one.   The patient may need specialty follow up if the symptoms continue, in spite of conservative treatment and management, for further workup, evaluation, consultation and treatment as clinically indicated and appropriate.  Patient/parent/caregiver verbalized understanding and agreement of plan as discussed.  All questions were addressed during visit.  Please see discharge instructions below for further  details of plan.  Discharge Instructions:   Discharge Instructions      Your symptoms and my physical exam findings are concerning for exacerbation of your underlying allergies.   To avoid catching frequent respiratory infections, having skin reactions, dealing with eye irritation, losing sleep, missing work, etc., due to uncontrolled allergies, it is important that you begin/continue your allergy regimen and are consistent with taking your meds exactly as prescribed.   Please see the list below for recommended medications, dosages and frequencies to provide relief of current symptoms:     Zyrtec (cetirizine): This is an excellent second-generation antihistamine that helps to reduce respiratory inflammatory response to environmental allergens.  In some patients, this medication can cause daytime sleepiness so I recommend that you take 1 tablet daily at bedtime.     Singulair (montelukast): This is a mast cell stabilizer that works well with antihistamines.  Mast cells are responsible for stimulating histamine production so you can imagine that if we can reduce the activity of your mast cells, then fewer histamines will be produced and inflammation caused by allergy exposure will be significantly reduced.  I recommend that you take this medication at the same time you take your antihistamine.   Flonase (fluticasone): This is a steroid nasal spray that you use once daily, 1 spray in each nare.  This medication does not work well if you decide to use it only used as you feel you need to, it works best used on a daily basis.  After 3 to  5 days of use, you will notice significant reduction of the inflammation and mucus production that is currently being caused by exposure to allergens, whether seasonal or environmental.  The most common side effect of this medication is nosebleeds.  If you experience a nosebleed, please discontinue use for 1 week, then feel free to resume.  I have provided you with a  prescription.     If you find that your health insurance will not pay for allergy medications, please consider downloading the GoodRx app and using to get a better price than the "off the shelf" price.     If you find that you have not had significant relief of your symptoms in the next 7 to 10 days, please follow-up with your primary care provider or return here to urgent care for repeat evaluation and further recommendations.   Thank you for visiting urgent care today.  We appreciate the opportunity to participate in your care.         This office note has been dictated using Museum/gallery curator.  Unfortunately, this method of dictation can sometimes lead to typographical or grammatical errors.  I apologize for your inconvenience in advance if this occurs.  Please do not hesitate to reach out to me if clarification is needed.      Lynden [place] Scales, PA-C 01/05/22 1450

## 2022-01-05 NOTE — ED Triage Notes (Signed)
Pt c/o cough that has gotten worse x about 6 days. The patient states he has been taking OTC cold/ flu meds.

## 2022-01-05 NOTE — Discharge Instructions (Signed)
Your symptoms and my physical exam findings are concerning for exacerbation of your underlying allergies.   To avoid catching frequent respiratory infections, having skin reactions, dealing with eye irritation, losing sleep, missing work, etc., due to uncontrolled allergies, it is important that you begin/continue your allergy regimen and are consistent with taking your meds exactly as prescribed.   Please see the list below for recommended medications, dosages and frequencies to provide relief of current symptoms:     Zyrtec (cetirizine): This is an excellent second-generation antihistamine that helps to reduce respiratory inflammatory response to environmental allergens.  In some patients, this medication can cause daytime sleepiness so I recommend that you take 1 tablet daily at bedtime.     Singulair (montelukast): This is a mast cell stabilizer that works well with antihistamines.  Mast cells are responsible for stimulating histamine production so you can imagine that if we can reduce the activity of your mast cells, then fewer histamines will be produced and inflammation caused by allergy exposure will be significantly reduced.  I recommend that you take this medication at the same time you take your antihistamine.   Flonase (fluticasone): This is a steroid nasal spray that you use once daily, 1 spray in each nare.  This medication does not work well if you decide to use it only used as you feel you need to, it works best used on a daily basis.  After 3 to 5 days of use, you will notice significant reduction of the inflammation and mucus production that is currently being caused by exposure to allergens, whether seasonal or environmental.  The most common side effect of this medication is nosebleeds.  If you experience a nosebleed, please discontinue use for 1 week, then feel free to resume.  I have provided you with a prescription.     If you find that your health insurance will not pay for allergy  medications, please consider downloading the GoodRx app and using to get a better price than the "off the shelf" price.     If you find that you have not had significant relief of your symptoms in the next 7 to 10 days, please follow-up with your primary care provider or return here to urgent care for repeat evaluation and further recommendations.   Thank you for visiting urgent care today.  We appreciate the opportunity to participate in your care.

## 2022-01-29 ENCOUNTER — Encounter (HOSPITAL_BASED_OUTPATIENT_CLINIC_OR_DEPARTMENT_OTHER): Payer: Self-pay

## 2022-01-29 DIAGNOSIS — R110 Nausea: Secondary | ICD-10-CM | POA: Insufficient documentation

## 2022-01-29 DIAGNOSIS — R55 Syncope and collapse: Secondary | ICD-10-CM | POA: Insufficient documentation

## 2022-01-29 LAB — CBC WITH DIFFERENTIAL/PLATELET
Abs Immature Granulocytes: 0.02 10*3/uL (ref 0.00–0.07)
Basophils Absolute: 0 10*3/uL (ref 0.0–0.1)
Basophils Relative: 1 %
Eosinophils Absolute: 0.1 10*3/uL (ref 0.0–0.5)
Eosinophils Relative: 1 %
HCT: 47.3 % (ref 39.0–52.0)
Hemoglobin: 16.4 g/dL (ref 13.0–17.0)
Immature Granulocytes: 0 %
Lymphocytes Relative: 16 %
Lymphs Abs: 1.2 10*3/uL (ref 0.7–4.0)
MCH: 29 pg (ref 26.0–34.0)
MCHC: 34.7 g/dL (ref 30.0–36.0)
MCV: 83.6 fL (ref 80.0–100.0)
Monocytes Absolute: 0.5 10*3/uL (ref 0.1–1.0)
Monocytes Relative: 7 %
Neutro Abs: 5.9 10*3/uL (ref 1.7–7.7)
Neutrophils Relative %: 75 %
Platelets: 253 10*3/uL (ref 150–400)
RBC: 5.66 MIL/uL (ref 4.22–5.81)
RDW: 12.1 % (ref 11.5–15.5)
WBC: 7.8 10*3/uL (ref 4.0–10.5)
nRBC: 0 % (ref 0.0–0.2)

## 2022-01-29 LAB — COMPREHENSIVE METABOLIC PANEL
ALT: 25 U/L (ref 0–44)
AST: 20 U/L (ref 15–41)
Albumin: 5 g/dL (ref 3.5–5.0)
Alkaline Phosphatase: 80 U/L (ref 38–126)
Anion gap: 9 (ref 5–15)
BUN: 15 mg/dL (ref 6–20)
CO2: 27 mmol/L (ref 22–32)
Calcium: 9.6 mg/dL (ref 8.9–10.3)
Chloride: 103 mmol/L (ref 98–111)
Creatinine, Ser: 0.77 mg/dL (ref 0.61–1.24)
GFR, Estimated: >60 mL/min (ref >60)
Glucose, Bld: 117 mg/dL — ABNORMAL HIGH (ref 70–99)
Potassium: 3.9 mmol/L (ref 3.5–5.1)
Sodium: 139 mmol/L (ref 135–145)
Total Bilirubin: 0.6 mg/dL (ref 0.3–1.2)
Total Protein: 7.6 g/dL (ref 6.5–8.1)

## 2022-01-29 LAB — URINALYSIS, ROUTINE W REFLEX MICROSCOPIC
Bilirubin Urine: NEGATIVE
Glucose, UA: NEGATIVE mg/dL
Hgb urine dipstick: NEGATIVE
Ketones, ur: NEGATIVE mg/dL
Leukocytes,Ua: NEGATIVE
Nitrite: NEGATIVE
Specific Gravity, Urine: 1.03 (ref 1.005–1.030)
pH: 7 (ref 5.0–8.0)

## 2022-01-29 NOTE — ED Triage Notes (Signed)
Pt states nausea has been going on for months.   Was getting into shower and became very dizzy.   States he did not "pass out"   Has not been seen for the nausea he has been experiencing for months.

## 2022-01-30 ENCOUNTER — Emergency Department (HOSPITAL_BASED_OUTPATIENT_CLINIC_OR_DEPARTMENT_OTHER)
Admission: EM | Admit: 2022-01-30 | Discharge: 2022-01-30 | Disposition: A | Discharge status: Home / Self Care | Payer: Self-pay | Attending: Emergency Medicine | Admitting: Emergency Medicine

## 2022-01-30 DIAGNOSIS — R55 Syncope and collapse: Secondary | ICD-10-CM

## 2022-01-30 NOTE — ED Notes (Signed)
ED Provider at bedside. 

## 2022-01-30 NOTE — ED Provider Notes (Signed)
[facility] Provider Note  CSN: [id number] Arrival date & time: 01/29/22 2037  Chief Complaint(s) Nausea and Dizziness  HPI Craig Pearson is a 21 y.o. male {Add pertinent medical, surgical, social history, OB history to HPI:1}    Dizziness   Past Medical History Past Medical History:  Diagnosis Date   Abdominal pain    Allergy    seasonal    Anxiety    Constipation 04/18/2011   Depression    Epigastric abdominal pain 04/18/2011   Vision abnormalities    wears glasses   Patient Active Problem List   Diagnosis Date Noted   Encounter for routine child health examination without abnormal findings 11/16/2016   Home Medication(s) Prior to Admission medications   Medication Sig Start Date End Date Taking? Authorizing Provider  cetirizine (ZYRTEC ALLERGY) 10 MG tablet Take 1 tablet (10 mg total) by mouth at bedtime. 01/05/22 07/04/22  Lynden [place] Scales, PA-C  famotidine (PEPCID) 20 MG tablet Take 1 tablet (20 mg total) by mouth daily. 05/17/20 06/16/20  Leveda Anna, NP  fluticasone (FLONASE) 50 MCG/ACT nasal spray Place 1 spray into both nostrils daily. Begin by using 2 sprays in each nare daily for 3 to 5 days, then decrease to 1 spray in each nare daily. 01/05/22   Lynden [place] Scales, PA-C  montelukast (SINGULAIR) 10 MG tablet Take 1 tablet (10 mg total) by mouth at bedtime. 01/05/22 04/05/22  Lynden [place] Scales, PA-C                                                                                                                                    Allergies Patient has no known allergies.  Review of Systems Review of Systems  Neurological:  Positive for dizziness.   As noted in HPI  Physical Exam Vital Signs  I have reviewed the triage vital signs BP 118/68   Pulse 68   Temp 98.7 F (37.1 C)   Resp 16   Ht 5\' 11"  (1.803 m)   Wt 99.8 kg   SpO2 99%   BMI 30.68 kg/m  *** Physical Exam  ED Results and Treatments Labs (all labs  ordered are listed, but only abnormal results are displayed) Labs Reviewed  COMPREHENSIVE METABOLIC PANEL - Abnormal; Notable for the following components:      Result Value   Glucose, Bld 117 (*)    All other components within normal limits  URINALYSIS, ROUTINE W REFLEX MICROSCOPIC - Abnormal; Notable for the following components:   Protein, ur TRACE (*)    All other components within normal limits  CBC WITH DIFFERENTIAL/PLATELET  EKG  EKG Interpretation  Date/Time:  Sunday January 29 2022 20:47:48 EST Ventricular Rate:  85 PR Interval:  126 QRS Duration: 86 QT Interval:  348 QTC Calculation: 414 R Axis:   77 Text Interpretation: Normal sinus rhythm with sinus arrhythmia Right atrial enlargement Nonspecific ST abnormality Abnormal ECG When compared with ECG of 19-Feb-2021 09:30, T wave inversion no longer evident in Inferior leads Confirmed by Vivi Barrack [phone]) on 01/29/2022 8:50:29 PM       Radiology No results found.  Medications Ordered in ED Medications - No data to display                                                                                                                                   Procedures Procedures  (including critical care time)  Medical Decision Making / ED Course   Medical Decision Making Amount and/or Complexity of Data Reviewed Labs: ordered.          Final Clinical Impression(s) / ED Diagnoses Final diagnoses:  None    {Document critical care time when appropriate:1}  {Document review of labs and clinical decision tools ie heart score, Chads2Vasc2 etc:1}  {Document your independent review of radiology images, and any outside records:1} {Document your discussion with family members, caretakers, and with consultants:1} {Document social determinants of health affecting pt's care:1} {Document your decision  making why or why not admission, treatments were needed:1} This chart was dictated using voice recognition software.  Despite best efforts to proofread,  errors can occur which can change the documentation meaning.

## 2022-01-30 NOTE — ED Notes (Signed)
Pt agreeable with d/c plan as discussed by provider- this nurse has verbally reinforced d/c instructions and provided pt with written copy - pt acknowledges verbal understanding and denies any addl questions, concerns, needs - pt ambulatory independently at d/c with steady gait; vitals stable; no distress.   

## 2022-02-16 ENCOUNTER — Emergency Department (HOSPITAL_BASED_OUTPATIENT_CLINIC_OR_DEPARTMENT_OTHER): Payer: Managed Care, Other (non HMO) | Admitting: Radiology

## 2022-02-16 ENCOUNTER — Emergency Department (HOSPITAL_BASED_OUTPATIENT_CLINIC_OR_DEPARTMENT_OTHER)
Admission: EM | Admit: 2022-02-16 | Discharge: 2022-02-16 | Disposition: A | Discharge status: Home / Self Care | Payer: Managed Care, Other (non HMO) | Attending: Emergency Medicine | Admitting: Emergency Medicine

## 2022-02-16 ENCOUNTER — Encounter (HOSPITAL_BASED_OUTPATIENT_CLINIC_OR_DEPARTMENT_OTHER): Payer: Self-pay | Admitting: Emergency Medicine

## 2022-02-16 ENCOUNTER — Other Ambulatory Visit: Payer: Self-pay

## 2022-02-16 DIAGNOSIS — Z1152 Encounter for screening for COVID-19: Secondary | ICD-10-CM | POA: Insufficient documentation

## 2022-02-16 DIAGNOSIS — J45909 Unspecified asthma, uncomplicated: Secondary | ICD-10-CM | POA: Insufficient documentation

## 2022-02-16 DIAGNOSIS — J209 Acute bronchitis, unspecified: Secondary | ICD-10-CM | POA: Insufficient documentation

## 2022-02-16 DIAGNOSIS — R059 Cough, unspecified: Secondary | ICD-10-CM | POA: Diagnosis present

## 2022-02-16 LAB — RESP PANEL BY RT-PCR (FLU A&B, COVID) ARPGX2
Influenza A by PCR: NEGATIVE
Influenza B by PCR: NEGATIVE
SARS Coronavirus 2 by RT PCR: NEGATIVE

## 2022-02-16 LAB — GROUP A STREP BY PCR: Group A Strep by PCR: NOT DETECTED

## 2022-02-16 MED ORDER — ALBUTEROL SULFATE HFA 108 (90 BASE) MCG/ACT IN AERS
2.0000 | INHALATION_SPRAY | RESPIRATORY_TRACT | Status: DC | PRN
  Administered 2022-02-16: 2 via RESPIRATORY_TRACT
  Filled 2022-02-16: qty 6.7

## 2022-02-16 MED ORDER — AZITHROMYCIN 250 MG PO TABS: 250.0000 mg | ORAL_TABLET | Freq: Every day | ORAL | 0 refills | Status: DC

## 2022-02-16 MED ORDER — BENZONATATE 100 MG PO CAPS: 100.0000 mg | ORAL_CAPSULE | Freq: Three times a day (TID) | ORAL | 0 refills | Status: DC

## 2022-02-16 NOTE — ED Provider Notes (Signed)
[facility] EMERGENCY DEPT Provider Note   CSN: [id number] Arrival date & time: 02/16/22  1242     History  Chief Complaint  Patient presents with   URI    Craig Pearson is a 21 y.o. male.  Patient with history of asthma as a child presents to the emergency department today for evaluation of cough.  Today is day 8 of symptoms.  No fevers.  He has had sore throat and nasal congestion as well.  No nausea, vomiting, or diarrhea.  He has noted some wheezing.  Does not currently have albuterol.  Does take allergy medication.  He has used cough drops and some other over-the-counter medications for symptom control.  No known sick contacts.       Home Medications Prior to Admission medications   Medication Sig Start Date End Date Taking? Authorizing Provider  azithromycin (ZITHROMAX) 250 MG tablet Take 1 tablet (250 mg total) by mouth daily. Take first 2 tablets together, then 1 every day until finished. 02/16/22  Yes Renne Crigler, PA-C  benzonatate (TESSALON) 100 MG capsule Take 1 capsule (100 mg total) by mouth every 8 (eight) hours. 02/16/22  Yes Renne Crigler, PA-C  cetirizine (ZYRTEC ALLERGY) 10 MG tablet Take 1 tablet (10 mg total) by mouth at bedtime. 01/05/22 07/04/22  Theadora Rama Scales, PA-C  famotidine (PEPCID) 20 MG tablet Take 1 tablet (20 mg total) by mouth daily. 05/17/20 06/16/20  Estelle June, NP  fluticasone (FLONASE) 50 MCG/ACT nasal spray Place 1 spray into both nostrils daily. Begin by using 2 sprays in each nare daily for 3 to 5 days, then decrease to 1 spray in each nare daily. 01/05/22   Theadora Rama Scales, PA-C  montelukast (SINGULAIR) 10 MG tablet Take 1 tablet (10 mg total) by mouth at bedtime. 01/05/22 04/05/22  Theadora Rama Scales, PA-C      Allergies    Patient has no known allergies.    Review of Systems   Review of Systems  Physical Exam Updated Vital Signs BP (!) 134/94 (BP Location: Right Arm)   Pulse 92   Temp 98.4 F (36.9  C)   Resp 18   SpO2 99%  Physical Exam Vitals and nursing note reviewed.  Constitutional:      Appearance: He is well-developed.  HENT:     Head: Normocephalic and atraumatic.     Jaw: No trismus.     Right Ear: Tympanic membrane, ear canal and external ear normal.     Left Ear: Tympanic membrane, ear canal and external ear normal.     Nose: Nose normal. No mucosal edema or rhinorrhea.     Mouth/Throat:     Mouth: Mucous membranes are not dry.     Pharynx: Uvula midline. No oropharyngeal exudate, posterior oropharyngeal erythema or uvula swelling.     Tonsils: No tonsillar abscesses.  Eyes:     General:        Right eye: No discharge.        Left eye: No discharge.     Conjunctiva/sclera: Conjunctivae normal.  Cardiovascular:     Rate and Rhythm: Normal rate and regular rhythm.     Heart sounds: Normal heart sounds.  Pulmonary:     Effort: Pulmonary effort is normal. No respiratory distress.     Breath sounds: Normal breath sounds. No wheezing or rales.     Comments: Occasional cough during exam Abdominal:     Palpations: Abdomen is soft.     Tenderness: There is  no abdominal tenderness.  Musculoskeletal:     Cervical back: Normal range of motion and neck supple.  Skin:    General: Skin is warm and dry.  Neurological:     Mental Status: He is alert.     ED Results / Procedures / Treatments   Labs (all labs ordered are listed, but only abnormal results are displayed) Labs Reviewed  RESP PANEL BY RT-PCR (FLU A&B, COVID) ARPGX2  GROUP A STREP BY PCR    EKG None  Radiology No results found.  Procedures Procedures    Medications Ordered in ED Medications  albuterol (VENTOLIN HFA) 108 (90 Base) MCG/ACT inhaler 2 puff (has no administration in time range)    ED Course/ Medical Decision Making/ A&P    Patient seen and examined. History obtained directly from patient. Work-up including labs, imaging, EKG ordered in triage, if performed, were reviewed.     Labs/EKG: Independently reviewed and interpreted.  This included: COVID-negative, strep test negative.  Imaging: Added chest x-ray given duration of symptoms  Medications/Fluids: None ordered  Most recent vital signs reviewed and are as follows: BP (!) 134/94 (BP Location: Right Arm)   Pulse 92   Temp 98.4 F (36.9 C)   Resp 18   SpO2 99%   Initial impression: Respiratory illness, possibly bronchitis  3:40 PM Reassessment performed. Patient appears stable.  Imaging personally visualized and interpreted including: Chest x-ray, agree negative.  Reviewed pertinent lab work and imaging with patient at bedside. Questions answered.   Most current vital signs reviewed and are as follows: BP (!) 134/94 (BP Location: Right Arm)   Pulse 92   Temp 98.4 F (36.9 C)   Resp 18   SpO2 99%   Plan: Discharge to home.   Prescriptions written for: Azithromycin after discussion with patient due to duration of illness approaching 10 days, Tessalon for symptom control.  He is provided with an albuterol inhaler to use for cough and wheezing.  Other home care instructions discussed: Rest, hydration, OTC meds  ED return instructions discussed: Worsening shortness of breath, persistent vomiting  Follow-up instructions discussed: Patient encouraged to follow-up with their PCP in 7 days.                                Medical Decision Making Amount and/or Complexity of Data Reviewed Radiology: ordered.  Risk Prescription drug management.   Patient with ongoing cough and congestion.  Likely respiratory illness.  His symptoms have been ongoing for greater than 7 days.  No pneumonia visualized on x-ray and lungs are clear.  He does have a history of asthma which could be contributing.  No concern for ACS, PE, pneumothorax.  COVID and flu are negative.  Strep test is negative.  At this point, will treat for acute bronchitis.         Final Clinical Impression(s) / ED  Diagnoses Final diagnoses:  Acute bronchitis, unspecified organism    Rx / DC Orders ED Discharge Orders          Ordered    azithromycin (ZITHROMAX) 250 MG tablet  Daily        02/16/22 1537    benzonatate (TESSALON) 100 MG capsule  Every 8 hours        02/16/22 1537              Renne Crigler, PA-C 02/16/22 1542    Derwood Kaplan, MD 02/17/22 [phone]

## 2022-02-16 NOTE — Discharge Instructions (Signed)
Please read and follow all provided instructions.  Your diagnoses today include:  1. Acute bronchitis, unspecified organism     Tests performed today include: Chest x-ray - does not show any pneumonia COVID and flu testing, strep testing: Negative Vital signs. See below for your results today.   Medications prescribed:  Azithromycin - antibiotic for respiratory infection  You have been prescribed an antibiotic medicine: take the entire course of medicine even if you are feeling better. Stopping early can cause the antibiotic not to work.  Albuterol inhaler - medication that opens up your airway  Use inhaler as follows: 1-2 puffs with spacer every 4 hours as needed for wheezing, cough, or shortness of breath.   Tessalon Perles - cough suppressant medication  Take any prescribed medications only as directed.  Home care instructions:  Follow any educational materials contained in this packet.  Follow-up instructions: Please follow-up with your primary care provider in the next 7 days for further evaluation of your symptoms and a recheck if you are not feeling better.   Return instructions:  Please return to the Emergency Department if you experience worsening symptoms. Please return with worsening wheezing, shortness of breath, or difficulty breathing. Return with persistent fever above 101F.  Please return if you have any other emergent concerns.  Additional Information:  Your vital signs today were: BP (!) 134/94 (BP Location: Right Arm)   Pulse 92   Temp 98.4 F (36.9 C)   Resp 18   SpO2 99%  If your blood pressure (BP) was elevated above 135/85 this visit, please have this repeated by your doctor within one month. --------------

## 2022-02-16 NOTE — ED Triage Notes (Signed)
Sore throat for about a week, congestion/nasal drainage,cough ,gotten worse over the week.

## 2022-03-09 ENCOUNTER — Emergency Department (HOSPITAL_COMMUNITY)
Admission: EM | Admit: 2022-03-09 | Discharge: 2022-03-09 | Discharge status: Against Medical Advice | Payer: Managed Care, Other (non HMO) | Attending: Emergency Medicine | Admitting: Emergency Medicine

## 2022-03-09 DIAGNOSIS — R109 Unspecified abdominal pain: Secondary | ICD-10-CM | POA: Insufficient documentation

## 2022-03-09 DIAGNOSIS — R112 Nausea with vomiting, unspecified: Secondary | ICD-10-CM | POA: Insufficient documentation

## 2022-03-09 DIAGNOSIS — J029 Acute pharyngitis, unspecified: Secondary | ICD-10-CM | POA: Insufficient documentation

## 2022-03-09 DIAGNOSIS — Z5321 Procedure and treatment not carried out due to patient leaving prior to being seen by health care provider: Secondary | ICD-10-CM | POA: Insufficient documentation

## 2022-03-09 LAB — CBC
HCT: 49.2 % (ref 39.0–52.0)
Hemoglobin: 17.3 g/dL — ABNORMAL HIGH (ref 13.0–17.0)
MCH: 29.1 pg (ref 26.0–34.0)
MCHC: 35.2 g/dL (ref 30.0–36.0)
MCV: 82.7 fL (ref 80.0–100.0)
Platelets: 248 10*3/uL (ref 150–400)
RBC: 5.95 MIL/uL — ABNORMAL HIGH (ref 4.22–5.81)
RDW: 11.9 % (ref 11.5–15.5)
WBC: 13 10*3/uL — ABNORMAL HIGH (ref 4.0–10.5)
nRBC: 0 % (ref 0.0–0.2)

## 2022-03-09 LAB — COMPREHENSIVE METABOLIC PANEL
ALT: 26 U/L (ref 0–44)
AST: 28 U/L (ref 15–41)
Albumin: 4.9 g/dL (ref 3.5–5.0)
Alkaline Phosphatase: 76 U/L (ref 38–126)
Anion gap: 12 (ref 5–15)
BUN: 15 mg/dL (ref 6–20)
CO2: 24 mmol/L (ref 22–32)
Calcium: 10 mg/dL (ref 8.9–10.3)
Chloride: 103 mmol/L (ref 98–111)
Creatinine, Ser: 0.89 mg/dL (ref 0.61–1.24)
GFR, Estimated: >60 mL/min (ref >60)
Glucose, Bld: 170 mg/dL — ABNORMAL HIGH (ref 70–99)
Potassium: 4.4 mmol/L (ref 3.5–5.1)
Sodium: 139 mmol/L (ref 135–145)
Total Bilirubin: 1.1 mg/dL (ref 0.3–1.2)
Total Protein: 7.5 g/dL (ref 6.5–8.1)

## 2022-03-09 LAB — LIPASE, BLOOD: Lipase: 27 U/L (ref 11–51)

## 2022-03-09 MED ORDER — ONDANSETRON 4 MG PO TBDP
4.0000 mg | ORAL_TABLET | Freq: Once | ORAL | Status: AC | PRN
  Administered 2022-03-09: 4 mg via ORAL
  Filled 2022-03-09: qty 1

## 2022-03-09 NOTE — ED Notes (Signed)
Patient did not respond for vitals x3 

## 2022-03-09 NOTE — ED Triage Notes (Signed)
Patient brought to ED by his mother, here for evaluation of vomiting that started earlier this morning. VSS. Patient is alert, oriented, and in no apparent distress at this time.

## 2022-03-09 NOTE — ED Provider Triage Note (Signed)
Emergency Medicine Provider Triage Evaluation Note  Craig Pearson , a 21 y.o. male  was evaluated in triage.  Pt complains of nausea, vomiting, sore throat, abdominal discomfort.  Recent sick contacts.   Review of Systems  Positive: As above Negative: As above  Physical Exam  BP 119/80   Pulse (!) 101   Temp 98.2 F (36.8 C) (Oral)   Resp 19   SpO2 100%  Gen:   Awake, no distress   Resp:  Normal effort  MSK:   Moves extremities without difficulty  Other:    Medical Decision Making  Medically screening exam initiated at 12:36 PM.  Appropriate orders placed.  Craig Pearson was informed that the remainder of the evaluation will be completed by another provider, this initial triage assessment does not replace that evaluation, and the importance of remaining in the ED until their evaluation is complete.     Marita Kansas, PA-C 03/09/22 1237

## 2022-04-15 ENCOUNTER — Ambulatory Visit
Admission: EM | Admit: 2022-04-15 | Discharge: 2022-04-15 | Disposition: A | Discharge status: Home / Self Care | Payer: 59 | Attending: Physician Assistant | Admitting: Physician Assistant

## 2022-04-15 ENCOUNTER — Other Ambulatory Visit: Payer: Self-pay

## 2022-04-15 ENCOUNTER — Encounter: Payer: Self-pay | Admitting: Emergency Medicine

## 2022-04-15 DIAGNOSIS — J069 Acute upper respiratory infection, unspecified: Secondary | ICD-10-CM | POA: Diagnosis present

## 2022-04-15 DIAGNOSIS — Z1152 Encounter for screening for COVID-19: Secondary | ICD-10-CM | POA: Insufficient documentation

## 2022-04-15 NOTE — ED Triage Notes (Signed)
Pt here for cough and congestion with some wheezing x 3 days

## 2022-04-15 NOTE — ED Provider Notes (Signed)
[facility]    CSN: [id number] Arrival date & time: 04/15/22  1321      History   Chief Complaint Chief Complaint  Patient presents with   Cough    Cough with wheezing and congestion - Entered by patient    HPI Jacob Cicero is a 22 y.o. male.   Patient here today for evaluation of cough and congestion that started about 3 days ago.  He notes he has had some occasional wheezing.  He has not had any fever.  He denies any sore throat.  He has not had any nausea or vomiting.  He has tried his inhaler with mild relief of symptoms.  The history is provided by the patient.  Cough Associated symptoms: wheezing   Associated symptoms: no chills, no ear pain, no eye discharge, no fever, no shortness of breath and no sore throat     Past Medical History:  Diagnosis Date   Abdominal pain    Allergy    seasonal    Anxiety    Constipation 04/18/2011   Depression    Epigastric abdominal pain 04/18/2011   Vision abnormalities    wears glasses    Patient Active Problem List   Diagnosis Date Noted   Encounter for routine child health examination without abnormal findings 11/16/2016    Past Surgical History:  Procedure Laterality Date   MASS EXCISION Right 07/26/2016   Procedure: RIGHT WRIST GANGLION EXCISION;  Surgeon: Earlie Server, MD;  Location: [facility];  Service: Orthopedics;  Laterality: Right;       Home Medications    Prior to Admission medications   Medication Sig Start Date End Date Taking? Authorizing Provider  benzonatate (TESSALON) 100 MG capsule Take 1 capsule (100 mg total) by mouth every 8 (eight) hours. 02/16/22   Carlisle Cater, PA-C  cetirizine (ZYRTEC ALLERGY) 10 MG tablet Take 1 tablet (10 mg total) by mouth at bedtime. 01/05/22 07/04/22  Lynden Oxford Scales, PA-C  famotidine (PEPCID) 20 MG tablet Take 1 tablet (20 mg total) by mouth daily. 05/17/20 06/16/20  Leveda Anna, NP  fluticasone (FLONASE) 50 MCG/ACT nasal spray Place 1  spray into both nostrils daily. Begin by using 2 sprays in each nare daily for 3 to 5 days, then decrease to 1 spray in each nare daily. 01/05/22   Lynden Oxford Scales, PA-C  montelukast (SINGULAIR) 10 MG tablet Take 1 tablet (10 mg total) by mouth at bedtime. 01/05/22 04/05/22  Lynden Oxford Scales, PA-C    Family History Family History  Problem Relation Age of Onset   GER disease Father    Arthritis Maternal Grandmother    Diabetes Maternal Grandmother    Hearing loss Maternal Grandmother    Hypertension Maternal Grandmother    Kidney disease Maternal Grandmother    Cancer Maternal Grandfather    Arthritis Paternal Grandmother    Heart disease Paternal Grandfather    Hirschsprung's disease Neg Hx     Social History Social History   Tobacco Use   Smoking status: Never    Passive exposure: Yes   Smokeless tobacco: Never   Tobacco comments:    Mother smokes outside   Vaping Use   Vaping Use: Never used  Substance Use Topics   Alcohol use: No   Drug use: No     Allergies   Patient has no known allergies.   Review of Systems Review of Systems  Constitutional:  Negative for chills and fever.  HENT:  Positive for  congestion. Negative for ear pain and sore throat.   Eyes:  Negative for discharge and redness.  Respiratory:  Positive for cough and wheezing. Negative for shortness of breath.   Gastrointestinal:  Negative for abdominal pain, diarrhea, nausea and vomiting.     Physical Exam Triage Vital Signs ED Triage Vitals  Enc Vitals Group     BP      Pulse      Resp      Temp      Temp src      SpO2      Weight      Height      Head Circumference      Peak Flow      Pain Score      Pain Loc      Pain Edu?      Excl. in [place]?    No data found.  Updated Vital Signs BP 132/86 (BP Location: Left Arm)   Pulse 80   Temp 98 F (36.7 C) (Oral)   Resp 18   SpO2 97%       Physical Exam Vitals and nursing note reviewed.  Constitutional:       General: He is not in acute distress.    Appearance: Normal appearance. He is not ill-appearing.  HENT:     Head: Normocephalic and atraumatic.     Nose: Congestion present.     Mouth/Throat:     Mouth: Mucous membranes are moist.     Pharynx: Oropharynx is clear. No oropharyngeal exudate or posterior oropharyngeal erythema.  Eyes:     Conjunctiva/sclera: Conjunctivae normal.  Cardiovascular:     Rate and Rhythm: Normal rate and regular rhythm.     Heart sounds: Normal heart sounds. No murmur heard. Pulmonary:     Effort: Pulmonary effort is normal. No respiratory distress.     Breath sounds: Normal breath sounds. No wheezing, rhonchi or rales.  Skin:    General: Skin is warm and dry.  Neurological:     Mental Status: He is alert.  Psychiatric:        Mood and Affect: Mood normal.        Thought Content: Thought content normal.      UC Treatments / Results  Labs (all labs ordered are listed, but only abnormal results are displayed) Labs Reviewed  SARS CORONAVIRUS 2 (TAT 6-24 HRS)    EKG   Radiology No results found.  Procedures Procedures (including critical care time)  Medications Ordered in UC Medications - No data to display  Initial Impression / Assessment and Plan / UC Course  I have reviewed the triage vital signs and the nursing notes.  Pertinent labs & imaging results that were available during my care of the patient were reviewed by me and considered in my medical decision making (see chart for details).    Suspect likely viral etiology of symptoms.  Low suspicion for flu given lack of fever.  Will screen for COVID and recommended symptomatic treatment and follow-up if symptoms fail to improve or worsen.  Patient expresses understanding.  Final Clinical Impressions(s) / UC Diagnoses   Final diagnoses:  Acute upper respiratory infection  Encounter for screening for COVID-19   Discharge Instructions   None    ED Prescriptions   None    PDMP  not reviewed this encounter.   Francene Finders, PA-C 04/15/22 [phone]

## 2022-04-17 LAB — SARS CORONAVIRUS 2 (TAT 6-24 HRS): SARS Coronavirus 2: NEGATIVE

## 2022-05-19 ENCOUNTER — Other Ambulatory Visit: Payer: Self-pay

## 2022-05-19 DIAGNOSIS — W230XXA Caught, crushed, jammed, or pinched between moving objects, initial encounter: Secondary | ICD-10-CM | POA: Diagnosis not present

## 2022-05-19 DIAGNOSIS — S61212A Laceration without foreign body of right middle finger without damage to nail, initial encounter: Secondary | ICD-10-CM | POA: Diagnosis not present

## 2022-05-19 DIAGNOSIS — Z23 Encounter for immunization: Secondary | ICD-10-CM | POA: Diagnosis not present

## 2022-05-19 DIAGNOSIS — S6991XA Unspecified injury of right wrist, hand and finger(s), initial encounter: Secondary | ICD-10-CM | POA: Diagnosis present

## 2022-05-19 DIAGNOSIS — Y990 Civilian activity done for income or pay: Secondary | ICD-10-CM | POA: Diagnosis not present

## 2022-05-20 ENCOUNTER — Other Ambulatory Visit: Payer: Self-pay

## 2022-05-20 ENCOUNTER — Emergency Department (HOSPITAL_BASED_OUTPATIENT_CLINIC_OR_DEPARTMENT_OTHER)
Admission: EM | Admit: 2022-05-20 | Discharge: 2022-05-20 | Disposition: A | Discharge status: Home / Self Care | Payer: 59 | Attending: Emergency Medicine | Admitting: Emergency Medicine

## 2022-05-20 DIAGNOSIS — S61212A Laceration without foreign body of right middle finger without damage to nail, initial encounter: Secondary | ICD-10-CM

## 2022-05-20 MED ORDER — TETANUS-DIPHTH-ACELL PERTUSSIS 5-2.5-18.5 LF-MCG/0.5 IM SUSY
0.5000 mL | PREFILLED_SYRINGE | Freq: Once | INTRAMUSCULAR | Status: AC
  Administered 2022-05-20: 0.5 mL via INTRAMUSCULAR
  Filled 2022-05-20: qty 0.5

## 2022-05-20 MED ORDER — LIDOCAINE HCL (PF) 1 % IJ SOLN
30.0000 mL | Freq: Once | INTRAMUSCULAR | Status: AC
  Administered 2022-05-20: 30 mL
  Filled 2022-05-20: qty 30

## 2022-05-20 MED ORDER — TETANUS-DIPHTHERIA TOXOIDS TD 5-2 LFU IM INJ: 0.5000 mL | INJECTION | Freq: Once | INTRAMUSCULAR | Status: DC

## 2022-05-20 NOTE — ED Triage Notes (Signed)
Pt cut his right third finger on grease wheel approx 2 hours ago while working. Pt has approx 2-3 cm jagged laceration. Pressure dressing applied in triage

## 2022-05-20 NOTE — Discharge Instructions (Signed)
You were seen today for laceration of the right middle finger.  Keep the dressing applied and splint for protection.  Suture needs to removed in 7 to 10 days.

## 2022-05-20 NOTE — ED Provider Notes (Signed)
[facility] Provider Note   CSN: [id number] Arrival date & time: 05/19/22  2357     History  Chief Complaint  Patient presents with   Laceration    Craig Pearson is a 22 y.o. male.  HPI     This is a 22 year old male who presents with a laceration to the right third digit.  Patient reports that he injured it at work.  Happened approximately 2 hours prior to arrival.  He was unable to get it to stop.  Unknown last tetanus shot.  He is primarily right-handed.  Home Medications Prior to Admission medications   Medication Sig Start Date End Date Taking? Authorizing Provider  benzonatate (TESSALON) 100 MG capsule Take 1 capsule (100 mg total) by mouth every 8 (eight) hours. 02/16/22   Carlisle Cater, PA-C  cetirizine (ZYRTEC ALLERGY) 10 MG tablet Take 1 tablet (10 mg total) by mouth at bedtime. 01/05/22 07/04/22  Lynden Oxford Scales, PA-C  famotidine (PEPCID) 20 MG tablet Take 1 tablet (20 mg total) by mouth daily. 05/17/20 06/16/20  Leveda Anna, NP  fluticasone (FLONASE) 50 MCG/ACT nasal spray Place 1 spray into both nostrils daily. Begin by using 2 sprays in each nare daily for 3 to 5 days, then decrease to 1 spray in each nare daily. 01/05/22   Lynden Oxford Scales, PA-C  montelukast (SINGULAIR) 10 MG tablet Take 1 tablet (10 mg total) by mouth at bedtime. 01/05/22 04/05/22  Lynden Oxford Scales, PA-C      Allergies    Patient has no known allergies.    Review of Systems   Review of Systems  Skin:  Positive for wound.  All other systems reviewed and are negative.   Physical Exam Updated Vital Signs BP 137/76   Pulse 82   Temp 98.3 F (36.8 C) (Oral)   Resp 16   Ht 1.829 m (6')   Wt 113.4 kg   SpO2 98%   BMI 33.91 kg/m  Physical Exam Vitals and nursing note reviewed.  Constitutional:      Appearance: He is well-developed. He is not ill-appearing.  HENT:     Head: Normocephalic and atraumatic.  Eyes:     Pupils:  Pupils are equal, round, and reactive to light.  Cardiovascular:     Rate and Rhythm: Normal rate and regular rhythm.  Pulmonary:     Effort: Pulmonary effort is normal. No respiratory distress.  Abdominal:     Palpations: Abdomen is soft.     Tenderness: There is no abdominal tenderness.  Musculoskeletal:     Cervical back: Neck supple.     Comments: Flexion and extension of all 5 digits intact  Lymphadenopathy:     Cervical: No cervical adenopathy.  Skin:    General: Skin is warm and dry.     Comments: Darkened discoloration of the bilateral hands, 2 cm flap-like laceration on the lateral aspect of the right middle digit just adjacent to the nailbed, no active bleeding, neurovascularly intact  Neurological:     Mental Status: He is alert and oriented to person, place, and time.     ED Results / Procedures / Treatments   Labs (all labs ordered are listed, but only abnormal results are displayed) Labs Reviewed - No data to display  EKG None  Radiology No results found.  Procedures .Marland KitchenLaceration Repair  Date/Time: 05/20/2022 1:54 AM  Performed by: Merryl Hacker, MD Authorized by: Merryl Hacker, MD   Consent:  Consent obtained:  Verbal   Consent given by:  Patient   Risks discussed:  Infection and pain Anesthesia:    Anesthesia method:  Local infiltration   Local anesthetic:  Lidocaine 1% w/o epi Laceration details:    Location:  Finger   Finger location:  R long finger   Length (cm):  2   Depth (mm):  2 Pre-procedure details:    Preparation:  Patient was prepped and draped in usual sterile fashion Exploration:    Limited defect created (wound extended): no     Imaging outcome: foreign body not noted     Wound exploration: wound explored through full range of motion     Contaminated: no   Treatment:    Area cleansed with:  Povidone-iodine and saline   Amount of cleaning:  Standard   Irrigation solution:  Sterile saline   Visualized foreign  bodies/material removed: no     Debridement:  None   Undermining:  None   Scar revision: no   Skin repair:    Repair method:  Sutures   Suture size:  4-0   Suture material:  Nylon   Suture technique:  Simple interrupted   Number of sutures:  1 Approximation:    Approximation:  Close Repair type:    Repair type:  Simple Post-procedure details:    Dressing:  Splint for protection and bulky dressing   Procedure completion:  Tolerated     Medications Ordered in ED Medications  Tdap (BOOSTRIX) injection 0.5 mL (0.5 mLs Intramuscular Given 05/20/22 0018)  lidocaine (PF) (XYLOCAINE) 1 % injection 30 mL (30 mLs Infiltration Given 05/20/22 0135)    ED Course/ Medical Decision Making/ A&P                             Medical Decision Making Risk Prescription drug management.   This patient presents to the ED for concern of laceration, this involves an extensive number of treatment options, and is a complaint that carries with it a high risk of complications and morbidity.  I considered the following differential and admission for this acute, potentially life threatening condition.  The differential diagnosis includes simple laceration, nailbed injury  MDM:    This is a 22 year old male who presents with laceration to right middle digit.  He is nontoxic and vital signs are reassuring.  Has a flap-like laceration that is nonbleeding at this time.  It is not gaping.  We discussed wound care versus 1 stitch to tack the flap down.  Patient opted for stitch.  This was repaired.  He was given a splint.  Tetanus was updated.  (Labs, imaging, consults)  Labs: I Ordered, and personally interpreted labs.  The pertinent results include: None  Imaging Studies ordered: I ordered imaging studies including none I independently visualized and interpreted imaging. I agree with the radiologist interpretation  Additional history obtained from chart review.  External records from outside source  obtained and reviewed including prior evaluations  Cardiac Monitoring: The patient was not maintained on a cardiac monitor.  If on the cardiac monitor, I personally viewed and interpreted the cardiac monitored which showed an underlying rhythm of: N/A  Reevaluation: After the interventions noted above, I reevaluated the patient and found that they have :improved  Social Determinants of Health:  lives independently  Disposition: Discharge  Co morbidities that complicate the patient evaluation  Past Medical History:  Diagnosis Date   Abdominal pain  Allergy    seasonal    Anxiety    Constipation 04/18/2011   Depression    Epigastric abdominal pain 04/18/2011   Vision abnormalities    wears glasses     Medicines Meds ordered this encounter  Medications   DISCONTD: tetanus & diphtheria toxoids (adult) (TENIVAC) injection 0.5 mL   Tdap (BOOSTRIX) injection 0.5 mL   lidocaine (PF) (XYLOCAINE) 1 % injection 30 mL    I have reviewed the patients home medicines and have made adjustments as needed  Problem List / ED Course: Problem List Items Addressed This Visit   None Visit Diagnoses     Laceration of right middle finger without foreign body without damage to nail, initial encounter    -  Primary                   Final Clinical Impression(s) / ED Diagnoses Final diagnoses:  Laceration of right middle finger without foreign body without damage to nail, initial encounter    Rx / DC Orders ED Discharge Orders     None         Dina Rich, Barbette Hair, MD 05/20/22 [phone]

## 2022-07-06 ENCOUNTER — Ambulatory Visit
Admission: RE | Admit: 2022-07-06 | Discharge: 2022-07-06 | Disposition: A | Discharge status: Home / Self Care | Payer: 59 | Source: Ambulatory Visit | Attending: Nurse Practitioner | Admitting: Nurse Practitioner

## 2022-07-06 VITALS — BP 122/79 | HR 73 | Temp 98.5°F | Resp 18

## 2022-07-06 DIAGNOSIS — L03031 Cellulitis of right toe: Secondary | ICD-10-CM

## 2022-07-06 MED ORDER — CEPHALEXIN 500 MG PO CAPS: 500.0000 mg | ORAL_CAPSULE | Freq: Four times a day (QID) | ORAL | 0 refills | Status: AC

## 2022-07-06 NOTE — ED Triage Notes (Signed)
Pt presents with right big toe pain. Expresses stabbing/throbbing pain. He does not see nail up in toe. Has been hurting for 2-3 weeks. Worse when using. Better when resting and staying off. Pt has some redness and swelling; not warm to the touch.
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
## 2022-07-06 NOTE — Discharge Instructions (Signed)
Start Keflex 4 times a day for 7 days Warm soapy water soaks to the toe Please follow-up with your PCP if your symptoms do not improve Please go to the emergency room if you have any worsening symptoms

## 2022-07-21 ENCOUNTER — Ambulatory Visit
Admission: RE | Admit: 2022-07-21 | Discharge: 2022-07-21 | Disposition: A | Discharge status: Home / Self Care | Payer: 59 | Source: Ambulatory Visit | Attending: Pediatrics | Admitting: Pediatrics

## 2022-07-21 VITALS — BP 125/73 | HR 75 | Temp 98.1°F | Resp 17

## 2022-07-21 DIAGNOSIS — M79674 Pain in right toe(s): Secondary | ICD-10-CM | POA: Diagnosis not present

## 2022-07-21 DIAGNOSIS — M7989 Other specified soft tissue disorders: Secondary | ICD-10-CM

## 2022-07-21 NOTE — ED Triage Notes (Signed)
Pt states he is having pain in his right big toe, was seen 2 weeks ago and was prescribed antibiotics and took them all but still having swelling and pain to big toe.
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
## 2022-07-21 NOTE — Discharge Instructions (Addendum)
As discussed, it is recommended that you follow-up with the podiatrist for further evaluation.  I would like for you to see your primary care physician in the event that you need a referral. May take Tylenol as needed for pain or discomfort. Apply ice to help with pain and swelling. Recommend shoes with good support and insoles while symptoms persist. In the interim, if you develop worsening redness, swelling, drainage from the right great toe, or other concerns, please follow-up in this clinic for further evaluation. Follow-up as needed.

## 2022-07-23 ENCOUNTER — Encounter (HOSPITAL_BASED_OUTPATIENT_CLINIC_OR_DEPARTMENT_OTHER): Payer: Self-pay

## 2022-07-23 ENCOUNTER — Other Ambulatory Visit: Payer: Self-pay

## 2022-07-23 DIAGNOSIS — L600 Ingrowing nail: Secondary | ICD-10-CM | POA: Diagnosis present

## 2022-07-23 NOTE — ED Triage Notes (Signed)
Pt states he has had toe pain x 1 month, saw UC last Friday ?ingrown toenail and bleeds

## 2022-07-24 ENCOUNTER — Emergency Department (HOSPITAL_BASED_OUTPATIENT_CLINIC_OR_DEPARTMENT_OTHER)
Admission: EM | Admit: 2022-07-24 | Discharge: 2022-07-24 | Disposition: A | Discharge status: Home / Self Care | Payer: 59 | Attending: Emergency Medicine | Admitting: Emergency Medicine

## 2022-07-24 DIAGNOSIS — L600 Ingrowing nail: Secondary | ICD-10-CM

## 2022-07-24 MED ORDER — LIDOCAINE HCL 2 % IJ SOLN
10.0000 mL | Freq: Once | INTRAMUSCULAR | Status: AC
  Administered 2022-07-24: 200 mg via INTRADERMAL
  Filled 2022-07-24: qty 20

## 2022-07-24 MED ORDER — CEPHALEXIN 500 MG PO CAPS: 500.0000 mg | ORAL_CAPSULE | Freq: Four times a day (QID) | ORAL | 0 refills | Status: DC

## 2022-07-24 NOTE — ED Provider Notes (Addendum)
[place] EMERGENCY DEPARTMENT AT [facility] Provider Note   CSN: [id number] Arrival date & time: 07/23/22  2330     History  Chief Complaint  Patient presents with   Toe Pain    Craig Pearson is a 22 y.o. male.  Patient is a 40-year-old male presenting with complaints of right great toe pain and swelling.  This has been worsening over the past month.  He has been to urgent care twice, but antibiotics have not helped.  He was referred to a podiatrist, but that appointment cannot be made for several weeks.  He denies any injury or trauma.  Pain is worse with ambulation with no alleviating factors.  The history is provided by the patient.       Home Medications Prior to Admission medications   Medication Sig Start Date End Date Taking? Authorizing Provider  benzonatate (TESSALON) 100 MG capsule Take 1 capsule (100 mg total) by mouth every 8 (eight) hours. 02/16/22   Renne Crigler, PA-C  cetirizine (ZYRTEC ALLERGY) 10 MG tablet Take 1 tablet (10 mg total) by mouth at bedtime. 01/05/22 07/21/22  Theadora Rama Scales, PA-C  famotidine (PEPCID) 20 MG tablet Take 1 tablet (20 mg total) by mouth daily. 05/17/20 06/16/20  Estelle June, NP  fluticasone (FLONASE) 50 MCG/ACT nasal spray Place 1 spray into both nostrils daily. Begin by using 2 sprays in each nare daily for 3 to 5 days, then decrease to 1 spray in each nare daily. 01/05/22   Theadora Rama Scales, PA-C  montelukast (SINGULAIR) 10 MG tablet Take 1 tablet (10 mg total) by mouth at bedtime. 01/05/22 04/05/22  Theadora Rama Scales, PA-C      Allergies    Patient has no known allergies.    Review of Systems   Review of Systems  All other systems reviewed and are negative.   Physical Exam Updated Vital Signs BP (!) 133/97   Pulse 70   Temp 98.9 F (37.2 C)   Resp 20   Ht 6' (1.829 m)   Wt 97.5 kg   SpO2 100%   BMI 29.16 kg/m  Physical Exam Vitals and nursing note reviewed.  Constitutional:       Appearance: Normal appearance.  Pulmonary:     Effort: Pulmonary effort is normal.  Skin:    General: Skin is warm and dry.     Comments: The right great toe is noted to have an ingrown section noted to the medial aspect of the nail.  There is surrounding inflammation of the skin, but no purulent drainage.  Neurological:     Mental Status: He is alert.     ED Results / Procedures / Treatments   Labs (all labs ordered are listed, but only abnormal results are displayed) Labs Reviewed - No data to display  EKG None  Radiology No results found.  Procedures Excise ingrown toenail  Date/Time: 07/24/2022 1:56 AM  Performed by: Geoffery Lyons, MD Authorized by: Geoffery Lyons, MD  Consent: Verbal consent obtained. Risks and benefits: risks, benefits and alternatives were discussed Consent given by: patient Patient understanding: patient states understanding of the procedure being performed Patient consent: the patient's understanding of the procedure matches consent given Procedure consent: procedure consent matches procedure scheduled Relevant documents: relevant documents present and verified Patient identity confirmed: verbally with patient Time out: Immediately prior to procedure a "time out" was called to verify the correct patient, procedure, equipment, support staff and site/side marked as required. Preparation: Patient was prepped and  draped in the usual sterile fashion. Local anesthesia used: yes Anesthesia: digital block  Anesthesia: Local anesthesia used: yes Local Anesthetic: lidocaine 2% without epinephrine Anesthetic total: 6 mL  Sedation: Patient sedated: no  Patient tolerance: patient tolerated the procedure well with no immediate complications       Medications Ordered in ED Medications  lidocaine (XYLOCAINE) 2 % (with pres) injection 200 mg (200 mg Intradermal Given 07/24/22 0132)    ED Course/ Medical Decision Making/ A&P  Patient presenting with  pain and swelling of the right great toe consistent with an ingrown toenail.  I removed the ingrown section of the toenail using a pair of scissors and under local anesthesia with a 2% lidocaine digital block.  Patient tolerated procedure well.  He will be discharged with Keflex and local wound care.  Final Clinical Impression(s) / ED Diagnoses Final diagnoses:  None    Rx / DC Orders ED Discharge Orders     None         Geoffery Lyons, MD 07/24/22 7829    Geoffery Lyons, MD 07/24/22 [phone]

## 2022-07-24 NOTE — Discharge Instructions (Signed)
Begin taking Keflex as prescribed.  Apply bacitracin or Neosporin to the ingrown area, then cover with a Band-Aid for the next several days.  Follow-up with podiatry if the toenail issues recur.

## 2022-09-29 ENCOUNTER — Emergency Department (HOSPITAL_BASED_OUTPATIENT_CLINIC_OR_DEPARTMENT_OTHER)
Admission: EM | Admit: 2022-09-29 | Discharge: 2022-09-29 | Disposition: A | Discharge status: Home / Self Care | Payer: 59 | Attending: Emergency Medicine | Admitting: Emergency Medicine

## 2022-09-29 ENCOUNTER — Encounter (HOSPITAL_BASED_OUTPATIENT_CLINIC_OR_DEPARTMENT_OTHER): Payer: Self-pay | Admitting: *Deleted

## 2022-09-29 ENCOUNTER — Other Ambulatory Visit: Payer: Self-pay

## 2022-09-29 DIAGNOSIS — X020XXA Exposure to flames in controlled fire in building or structure, initial encounter: Secondary | ICD-10-CM | POA: Diagnosis not present

## 2022-09-29 DIAGNOSIS — T23271A Burn of second degree of right wrist, initial encounter: Secondary | ICD-10-CM | POA: Diagnosis not present

## 2022-09-29 MED ORDER — HYDROCODONE-ACETAMINOPHEN 5-325 MG PO TABS
1.0000 | ORAL_TABLET | Freq: Once | ORAL | Status: AC
  Administered 2022-09-29: 1 via ORAL
  Filled 2022-09-29: qty 1

## 2022-09-29 MED ORDER — BACITRACIN ZINC 500 UNIT/GM EX OINT
TOPICAL_OINTMENT | Freq: Once | CUTANEOUS | Status: AC
  Administered 2022-09-29: 31.5 via TOPICAL
  Filled 2022-09-29: qty 28.35

## 2022-09-29 MED ORDER — HYDROCODONE-ACETAMINOPHEN 5-325 MG PO TABS: 1.0000 | ORAL_TABLET | Freq: Four times a day (QID) | ORAL | 0 refills | Status: DC | PRN

## 2022-09-29 NOTE — ED Notes (Signed)
Pt's wound cleansed with betadine and dermal wound cleanser, covered with bacitracin and 4x4, and secured with coban.
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
## 2022-11-03 ENCOUNTER — Emergency Department (HOSPITAL_BASED_OUTPATIENT_CLINIC_OR_DEPARTMENT_OTHER)
Admission: EM | Admit: 2022-11-03 | Discharge: 2022-11-03 | Disposition: A | Discharge status: Home / Self Care | Payer: 59

## 2022-11-03 ENCOUNTER — Other Ambulatory Visit: Payer: Self-pay

## 2022-11-03 ENCOUNTER — Encounter (HOSPITAL_BASED_OUTPATIENT_CLINIC_OR_DEPARTMENT_OTHER): Payer: Self-pay

## 2022-11-03 DIAGNOSIS — L03032 Cellulitis of left toe: Secondary | ICD-10-CM

## 2022-11-03 MED ORDER — AMOXICILLIN-POT CLAVULANATE 875-125 MG PO TABS: 1.0000 | ORAL_TABLET | Freq: Two times a day (BID) | ORAL | 0 refills | Status: AC

## 2022-11-03 NOTE — ED Triage Notes (Signed)
In-grown toenail on big toe of right foot. Pt reports having same before, waiting on referral to podiatry. Reports draining pus last night after pulling a piece out. Denies fever

## 2022-11-03 NOTE — Discharge Instructions (Addendum)
Take the antibiotics. Soak foot in warm soapy water 3-4x per day for 10-66minutes.  Follow-up with your primary doctor and podiatry.  Return immediately for fevers, chills, worsening pain, redness swells or any new or worsening symptoms that are concerning to you.

## 2022-11-03 NOTE — ED Provider Notes (Signed)
[place] EMERGENCY DEPARTMENT AT [facility] Provider Note   CSN: [id number] Arrival date & time: 11/03/22  1444     History  Chief Complaint  Patient presents with   Nail Problem    Craig Pearson is a 22 y.o. male.  22 year old male presents emergency department with paronychia.  Reportedly has had issues with toe in the past and is working to get to podiatry.  Reports increased pain yesterday with redness.  Soaked foot in warm water and was able to get some pus out.  No fevers, chills, numbness tingling changes in sensation.        Home Medications Prior to Admission medications   Medication Sig Start Date End Date Taking? Authorizing Provider  amoxicillin-clavulanate (AUGMENTIN) 875-125 MG tablet Take 1 tablet by mouth every 12 (twelve) hours for 7 days. 11/03/22 11/10/22 Yes Anapaola Kinsel, Harmon Dun, DO  benzonatate (TESSALON) 100 MG capsule Take 1 capsule (100 mg total) by mouth every 8 (eight) hours. 02/16/22   Renne Crigler, PA-C  cephALEXin (KEFLEX) 500 MG capsule Take 1 capsule (500 mg total) by mouth 4 (four) times daily. 07/24/22   Geoffery Lyons, MD  cetirizine (ZYRTEC ALLERGY) 10 MG tablet Take 1 tablet (10 mg total) by mouth at bedtime. 01/05/22 07/21/22  Theadora Rama Scales, PA-C  famotidine (PEPCID) 20 MG tablet Take 1 tablet (20 mg total) by mouth daily. 05/17/20 06/16/20  Estelle June, NP  fluticasone (FLONASE) 50 MCG/ACT nasal spray Place 1 spray into both nostrils daily. Begin by using 2 sprays in each nare daily for 3 to 5 days, then decrease to 1 spray in each nare daily. 01/05/22   Theadora Rama Scales, PA-C  HYDROcodone-acetaminophen (NORCO/VICODIN) 5-325 MG tablet Take 1 tablet by mouth every 6 (six) hours as needed for severe pain. 09/29/22   Pollyann Savoy, MD  montelukast (SINGULAIR) 10 MG tablet Take 1 tablet (10 mg total) by mouth at bedtime. 01/05/22 04/05/22  Theadora Rama Scales, PA-C      Allergies    Patient has no known allergies.    Review  of Systems   Review of Systems  Physical Exam Updated Vital Signs BP (!) 144/82 (BP Location: Right Arm)   Pulse 79   Temp 98 F (36.7 C)   Resp 17   Ht 6' (1.829 m)   Wt 99.8 kg   SpO2 100%   BMI 29.84 kg/m  Physical Exam Vitals and nursing note reviewed.  Constitutional:      General: He is not in acute distress.    Appearance: He is not toxic-appearing.  HENT:     Head: Normocephalic.     Nose: Nose normal.     Mouth/Throat:     Mouth: Mucous membranes are moist.  Eyes:     Conjunctiva/sclera: Conjunctivae normal.  Cardiovascular:     Rate and Rhythm: Normal rate.  Pulmonary:     Effort: Pulmonary effort is normal.  Abdominal:     General: Abdomen is flat.  Musculoskeletal:     Comments: There is some redness and erythema to the left great toe nailbed.  No overt abscess to be drained.  Tender to palpation.  Brisk cap refill in toe.  2+ DP pulses.  Neurological:     Mental Status: He is alert and oriented to person, place, and time.     ED Results / Procedures / Treatments   Labs (all labs ordered are listed, but only abnormal results are displayed) Labs Reviewed - No data to  display  EKG None  Radiology No results found.  Procedures Procedures    Medications Ordered in ED Medications - No data to display  ED Course/ Medical Decision Making/ A&P                                 Medical Decision Making 22 year old male present emergency department paronychia.  He is afebrile vital signs reassuring.  Physical exam without obvious drainable abscess. No signs of systemic illness with stable vitals. He is working with PCP to get into podiatry. Will give short course of antibiotics. Stable for discharge.   Risk Prescription drug management.           Final Clinical Impression(s) / ED Diagnoses Final diagnoses:  Paronychia of great toe of left foot    Rx / DC Orders ED Discharge Orders          Ordered    amoxicillin-clavulanate  (AUGMENTIN) 875-125 MG tablet  Every 12 hours        11/03/22 1557              Coral Spikes, DO 11/03/22 2356

## 2022-11-08 ENCOUNTER — Emergency Department (HOSPITAL_BASED_OUTPATIENT_CLINIC_OR_DEPARTMENT_OTHER)
Admission: EM | Admit: 2022-11-08 | Discharge: 2022-11-08 | Disposition: A | Discharge status: Home / Self Care | Payer: 59 | Attending: Emergency Medicine | Admitting: Emergency Medicine

## 2022-11-08 ENCOUNTER — Other Ambulatory Visit: Payer: Self-pay

## 2022-11-08 ENCOUNTER — Encounter (HOSPITAL_BASED_OUTPATIENT_CLINIC_OR_DEPARTMENT_OTHER): Payer: Self-pay | Admitting: Emergency Medicine

## 2022-11-08 ENCOUNTER — Emergency Department (HOSPITAL_BASED_OUTPATIENT_CLINIC_OR_DEPARTMENT_OTHER): Payer: 59

## 2022-11-08 DIAGNOSIS — R0602 Shortness of breath: Secondary | ICD-10-CM | POA: Diagnosis present

## 2022-11-08 DIAGNOSIS — J069 Acute upper respiratory infection, unspecified: Secondary | ICD-10-CM | POA: Diagnosis not present

## 2022-11-08 DIAGNOSIS — R42 Dizziness and giddiness: Secondary | ICD-10-CM | POA: Diagnosis not present

## 2022-11-08 DIAGNOSIS — R0989 Other specified symptoms and signs involving the circulatory and respiratory systems: Secondary | ICD-10-CM

## 2022-11-08 LAB — BASIC METABOLIC PANEL
Anion gap: 12 (ref 5–15)
BUN: 11 mg/dL (ref 6–20)
CO2: 26 mmol/L (ref 22–32)
Calcium: 10.4 mg/dL — ABNORMAL HIGH (ref 8.9–10.3)
Chloride: 102 mmol/L (ref 98–111)
Creatinine, Ser: 0.8 mg/dL (ref 0.61–1.24)
GFR, Estimated: >60 mL/min (ref >60)
Glucose, Bld: 118 mg/dL — ABNORMAL HIGH (ref 70–99)
Potassium: 3.8 mmol/L (ref 3.5–5.1)
Sodium: 140 mmol/L (ref 135–145)

## 2022-11-08 LAB — CBC
HCT: 49.9 % (ref 39.0–52.0)
Hemoglobin: 17.7 g/dL — ABNORMAL HIGH (ref 13.0–17.0)
MCH: 28.9 pg (ref 26.0–34.0)
MCHC: 35.5 g/dL (ref 30.0–36.0)
MCV: 81.5 fL (ref 80.0–100.0)
Platelets: 230 10*3/uL (ref 150–400)
RBC: 6.12 MIL/uL — ABNORMAL HIGH (ref 4.22–5.81)
RDW: 12.2 % (ref 11.5–15.5)
WBC: 10.1 10*3/uL (ref 4.0–10.5)
nRBC: 0 % (ref 0.0–0.2)

## 2022-11-08 NOTE — Discharge Instructions (Signed)
You were seen today for upper respiratory symptoms.  Continue supportive measures at home.  You may use Flonase or nasal saline for congestion.  Take Tylenol or ibuprofen for any body aches or pains.

## 2022-11-08 NOTE — ED Provider Notes (Signed)
[place] EMERGENCY DEPARTMENT AT [place] Provider Note   CSN: [id number] Arrival date & time: 11/08/22  0009     History  Chief Complaint  Patient presents with   Shortness of Breath   Dizziness    Success Dalley is a 22 y.o. male.  HPI     This is a 22 year old male who presents with upper respiratory symptoms including cough, congestion, shortness of breath.  This has been ongoing since Sunday.  He has also had some dizziness.  No fevers.  He is a non-smoker.  No known sick contacts.  Home Medications Prior to Admission medications   Medication Sig Start Date End Date Taking? Authorizing Provider  amoxicillin-clavulanate (AUGMENTIN) 875-125 MG tablet Take 1 tablet by mouth every 12 (twelve) hours for 7 days. 11/03/22 11/10/22  Coral Spikes, DO  benzonatate (TESSALON) 100 MG capsule Take 1 capsule (100 mg total) by mouth every 8 (eight) hours. 02/16/22   Renne Crigler, PA-C  cephALEXin (KEFLEX) 500 MG capsule Take 1 capsule (500 mg total) by mouth 4 (four) times daily. 07/24/22   Geoffery Lyons, MD  cetirizine (ZYRTEC ALLERGY) 10 MG tablet Take 1 tablet (10 mg total) by mouth at bedtime. 01/05/22 07/21/22  Theadora Rama Scales, PA-C  famotidine (PEPCID) 20 MG tablet Take 1 tablet (20 mg total) by mouth daily. 05/17/20 06/16/20  Estelle June, NP  fluticasone (FLONASE) 50 MCG/ACT nasal spray Place 1 spray into both nostrils daily. Begin by using 2 sprays in each nare daily for 3 to 5 days, then decrease to 1 spray in each nare daily. 01/05/22   Theadora Rama Scales, PA-C  HYDROcodone-acetaminophen (NORCO/VICODIN) 5-325 MG tablet Take 1 tablet by mouth every 6 (six) hours as needed for severe pain. 09/29/22   Pollyann Savoy, MD  montelukast (SINGULAIR) 10 MG tablet Take 1 tablet (10 mg total) by mouth at bedtime. 01/05/22 04/05/22  Theadora Rama Scales, PA-C      Allergies    Patient has no known allergies.    Review of Systems   Review of Systems   Constitutional:  Negative for fever.  HENT:  Positive for congestion.   Respiratory:  Positive for cough and shortness of breath.   Neurological:  Positive for dizziness.  All other systems reviewed and are negative.   Physical Exam Updated Vital Signs BP 134/87 (BP Location: Right Arm)   Pulse (!) 118   Temp 98.2 F (36.8 C)   Resp 19   Wt 99.8 kg   SpO2 98%   BMI 29.84 kg/m  Physical Exam Vitals and nursing note reviewed.  Constitutional:      Appearance: He is well-developed. He is not ill-appearing.  HENT:     Head: Normocephalic and atraumatic.     Nose: Congestion present.  Eyes:     Pupils: Pupils are equal, round, and reactive to light.  Cardiovascular:     Rate and Rhythm: Normal rate and regular rhythm.     Heart sounds: Normal heart sounds. No murmur heard. Pulmonary:     Effort: Pulmonary effort is normal. No respiratory distress.     Breath sounds: Normal breath sounds. No wheezing.  Abdominal:     General: Bowel sounds are normal.     Palpations: Abdomen is soft.     Tenderness: There is no abdominal tenderness. There is no rebound.  Musculoskeletal:     Cervical back: Neck supple.  Lymphadenopathy:     Cervical: No cervical adenopathy.  Skin:  General: Skin is warm and dry.  Neurological:     Mental Status: He is alert and oriented to person, place, and time.  Psychiatric:        Mood and Affect: Mood normal.     ED Results / Procedures / Treatments   Labs (all labs ordered are listed, but only abnormal results are displayed) Labs Reviewed  BASIC METABOLIC PANEL - Abnormal; Notable for the following components:      Result Value   Glucose, Bld 118 (*)    Calcium 10.4 (*)    All other components within normal limits  CBC - Abnormal; Notable for the following components:   RBC 6.12 (*)    Hemoglobin 17.7 (*)    All other components within normal limits    EKG EKG Interpretation Date/Time:  Wednesday November 08 2022 00:32:53  EDT Ventricular Rate:  104 PR Interval:  112 QRS Duration:  88 QT Interval:  316 QTC Calculation: 415 R Axis:   85  Text Interpretation: Sinus tachycardia Right atrial enlargement Nonspecific ST abnormality Abnormal ECG When compared with ECG of 29-Jan-2022 20:47, No significant change was found Confirmed by Ross Marcus ([id number]) on 11/08/2022 3:05:36 AM  Radiology DG Chest Portable 1 View  Result Date: 11/08/2022 CLINICAL DATA:  Shortness of breath EXAM: PORTABLE CHEST 1 VIEW COMPARISON:  02/16/2022 FINDINGS: The heart size and mediastinal contours are within normal limits. Both lungs are clear. The visualized skeletal structures are unremarkable. IMPRESSION: No active disease. Electronically Signed   By: Alcide Clever M.D.   On: 11/08/2022 01:04    Procedures Procedures    Medications Ordered in ED Medications - No data to display  ED Course/ Medical Decision Making/ A&P                                 Medical Decision Making Amount and/or Complexity of Data Reviewed Labs: ordered. Radiology: ordered.   This patient presents to the ED for concern of upper respiratory symptoms, this involves an extensive number of treatment options, and is a complaint that carries with it a high risk of complications and morbidity.  I considered the following differential and admission for this acute, potentially life threatening condition.  The differential diagnosis includes illness, allergies, pneumonia  MDM:    This is a 22 year old male who presents with multiple upper respiratory symptoms.  He is overall nontoxic.  Initially noted to be tachycardic to 118.  However on my evaluation he is in the 70s.  He is in no respiratory distress.  EKG shows no evidence of acute ischemia or arrhythmia.  Chest x-ray without pneumonia or other abnormality.  Labs are largely reassuring.  We discussed possibility of this being viral in nature.  Patient declines viral testing at this time.  Could be allergic  as well.  Recommend supportive measures including nasal saline, Flonase, Tylenol, ibuprofen.  No indication for albuterol or steroids at this time.  (Labs, imaging, consults)  Labs: I Ordered, and personally interpreted labs.  The pertinent results include: CBC, BMP  Imaging Studies ordered: I ordered imaging studies including chest x-ray I independently visualized and interpreted imaging. I agree with the radiologist interpretation  Additional history obtained from family at bedside.  External records from outside source obtained and reviewed including prior evaluations  Cardiac Monitoring: The patient was maintained on a cardiac monitor.  If on the cardiac monitor, I personally viewed and interpreted the cardiac  monitored which showed an underlying rhythm of: Sinus rhythm  Reevaluation: After the interventions noted above, I reevaluated the patient and found that they have :improved  Social Determinants of Health:  lives independently  Disposition: Discharge  Co morbidities that complicate the patient evaluation  Past Medical History:  Diagnosis Date   Abdominal pain    Allergy    seasonal    Anxiety    Constipation 04/18/2011   Depression    Epigastric abdominal pain 04/18/2011   Vision abnormalities    wears glasses     Medicines No orders of the defined types were placed in this encounter.   I have reviewed the patients home medicines and have made adjustments as needed  Problem List / ED Course: Problem List Items Addressed This Visit   None Visit Diagnoses     Upper respiratory symptom    -  Primary                   Final Clinical Impression(s) / ED Diagnoses Final diagnoses:  Upper respiratory symptom    Rx / DC Orders ED Discharge Orders     None         Shon Baton, MD 11/08/22 [phone]

## 2022-11-08 NOTE — ED Triage Notes (Signed)
Pt in with reported dizziness and sob that began Sunday night, first noticed after he cooked some chicken with cayenne pepper. Pt said it began with congestion, and he is now most concerned about the sob. Dizziness worse when standing. Pt does report cough ongoing since he had bronchitis 4 mos ago

## 2022-11-08 NOTE — ED Notes (Signed)
Patient verbalizes understanding of discharge instructions. Opportunity for questioning and answers were provided. Armband removed by staff, pt discharged from ED. Ambulated out to lobby with friend

## 2022-12-05 ENCOUNTER — Encounter: Payer: Self-pay | Admitting: Pediatrics

## 2023-03-06 ENCOUNTER — Encounter (HOSPITAL_BASED_OUTPATIENT_CLINIC_OR_DEPARTMENT_OTHER): Payer: Self-pay | Admitting: Emergency Medicine

## 2023-03-06 ENCOUNTER — Emergency Department (HOSPITAL_BASED_OUTPATIENT_CLINIC_OR_DEPARTMENT_OTHER): Payer: 59

## 2023-03-06 ENCOUNTER — Emergency Department (HOSPITAL_BASED_OUTPATIENT_CLINIC_OR_DEPARTMENT_OTHER)
Admission: EM | Admit: 2023-03-06 | Discharge: 2023-03-06 | Disposition: A | Discharge status: Home / Self Care | Payer: 59 | Attending: Emergency Medicine | Admitting: Emergency Medicine

## 2023-03-06 ENCOUNTER — Other Ambulatory Visit: Payer: Self-pay

## 2023-03-06 DIAGNOSIS — W228XXA Striking against or struck by other objects, initial encounter: Secondary | ICD-10-CM | POA: Insufficient documentation

## 2023-03-06 DIAGNOSIS — S0990XA Unspecified injury of head, initial encounter: Secondary | ICD-10-CM | POA: Diagnosis present

## 2023-03-06 DIAGNOSIS — Y990 Civilian activity done for income or pay: Secondary | ICD-10-CM | POA: Diagnosis not present

## 2023-03-06 DIAGNOSIS — S060X0A Concussion without loss of consciousness, initial encounter: Secondary | ICD-10-CM | POA: Insufficient documentation

## 2023-03-06 NOTE — ED Provider Notes (Signed)
[facility] Emergency Department Provider Note MRN:  [id number]  Arrival date & time: 03/06/23     Chief Complaint   Head Injury   History of Present Illness   Craig Pearson is a 22 y.o. year-old male with no pertinent past medical history presenting to the ED with chief complaint of head injury.  While at work in a hole draining some oil he stood up quickly and struck his head on a metal part of a car.  Having persistent headache since that time, had an episode of vomiting while at work.  Trauma occurred about 4 5 hours ago.  Denies neck or back pain, no chest pain or shortness of breath, no abdominal pain, no numbness or weakness to the arms or legs.  Review of Systems  A thorough review of systems was obtained and all systems are negative except as noted in the HPI and PMH.   Patient's Health History    Past Medical History:  Diagnosis Date   Abdominal pain    Allergy    seasonal    Anxiety    Constipation 04/18/2011   Depression    Epigastric abdominal pain 04/18/2011   Vision abnormalities    wears glasses    Past Surgical History:  Procedure Laterality Date   CHOLECYSTECTOMY     MASS EXCISION Right 07/26/2016   Procedure: RIGHT WRIST GANGLION EXCISION;  Surgeon: Frederico Hamman, MD;  Location: [facility];  Service: Orthopedics;  Laterality: Right;    Family History  Problem Relation Age of Onset   GER disease Father    Arthritis Maternal Grandmother    Diabetes Maternal Grandmother    Hearing loss Maternal Grandmother    Hypertension Maternal Grandmother    Kidney disease Maternal Grandmother    Cancer Maternal Grandfather    Arthritis Paternal Grandmother    Heart disease Paternal Grandfather    Hirschsprung's disease Neg Hx     Social History   Socioeconomic History   Marital status: Significant Other    Spouse name: Not on file   Number of children: Not on file   Years of education: Not on file   Highest education  level: Not on file  Occupational History   Not on file  Tobacco Use   Smoking status: Never    Passive exposure: Yes   Smokeless tobacco: Never   Tobacco comments:    Mother smokes outside   Vaping Use   Vaping status: Never Used  Substance and Sexual Activity   Alcohol use: Yes    Comment: occ   Drug use: No   Sexual activity: Never  Other Topics Concern   Not on file  Social History Narrative   Lives with mom, dad, 2 sisters, bro   Going into [employer]   Social Determinants of Health   Financial Resource Strain: Low Risk  (06/20/2022)   Received from [employer], [facility]   Overall Financial Resource Strain (CARDIA)    Difficulty of Paying Living Expenses: Not very hard  Food Insecurity: No Food Insecurity (06/20/2022)   Received from [facility], [facility]   Hunger Vital Sign    Worried About Running Out of Food in the Last Year: Never true    Ran Out of Food in the Last Year: Never true  Transportation Needs: No Transportation Needs (06/20/2022)   Received from [facility], [facility]   [facility] - Transportation    Lack of Transportation (Medical): No  Lack of Transportation (Non-Medical): No  Physical Activity: Sufficiently Active (06/20/2022)   Received from [facility], [facility]   Exercise Vital Sign    Days of Exercise per Week: 5 days    Minutes of Exercise per Session: 150+ min  Stress: No Stress Concern Present (07/07/2022)   Received from [facility], [facility] - Occupational Stress Questionnaire    Feeling of Stress : Only a little  Social Connections: Moderately Integrated (06/20/2022)   Received from [facility], [facility]   Social Network    How would you rate your social network (family, work, friends)?: Adequate participation with social networks  Intimate Partner Violence: Not At Risk (06/20/2022)   Received from [facility], [facility]   HITS    Over  the last 12 months how often did your partner physically hurt you?: Never    Over the last 12 months how often did your partner insult you or talk down to you?: Never    Over the last 12 months how often did your partner threaten you with physical harm?: Never    Over the last 12 months how often did your partner scream or curse at you?: Never     Physical Exam   Vitals:   03/06/23 0037  BP: (!) 140/88  Pulse: 100  Resp: 20  Temp: 98.2 F (36.8 C)  SpO2: 98%    CONSTITUTIONAL: Well-appearing, NAD NEURO/PSYCH:  Alert and oriented x 3, no focal deficits EYES:  eyes equal and reactive ENT/NECK:  no LAD, no JVD CARDIO: Regular rate, well-perfused, normal S1 and S2 PULM:  CTAB no wheezing or rhonchi GI/GU:  non-distended, non-tender MSK/SPINE:  No gross deformities, no edema SKIN:  no rash, atraumatic   *Additional and/or pertinent findings included in MDM below  Diagnostic and Interventional Summary    EKG Interpretation Date/Time:    Ventricular Rate:    PR Interval:    QRS Duration:    QT Interval:    QTC Calculation:   R Axis:      Text Interpretation:         Labs Reviewed - No data to display  CT HEAD WO CONTRAST ( )  Final Result      Medications - No data to display   Procedures  /  Critical Care Procedures  ED Course and Medical Decision Making  Initial Impression and Ddx Question concussion which is favored versus intracranial bleeding.  Did have episode of vomiting, having continued headache.  Will obtain CT head  Past medical/surgical history that increases complexity of ED encounter: None  Interpretation of Diagnostics CT head unremarkable  Patient Reassessment and Ultimate Disposition/Management     Discharge  Patient management required discussion with the following services or consulting groups:  None  Complexity of Problems Addressed Acute illness or injury that poses threat of life of bodily function  Additional Data Reviewed  and Analyzed Further history obtained from: None  Additional Factors Impacting ED Encounter Risk None  Elmer Sow. Pilar Plate, MD [facility] Emergency Medicine [facility] [email or web address]  Final Clinical Impressions(s) / ED Diagnoses     ICD-10-CM   1. Concussion without loss of consciousness, initial encounter  S06.0X0A       ED Discharge Orders     None        Discharge Instructions Discussed with and Provided to Patient:    Discharge Instructions      You were evaluated in the  Emergency Department and after careful evaluation, we did not find any emergent condition requiring admission or further testing in the hospital.  Your exam/testing today is overall reassuring.  CT scan did not show any significant injuries.  Symptoms likely due to a concussion.  Recommend mental and physical rest for the next 2 days as we discussed.  Please return to the Emergency Department if you experience any worsening of your condition.   Thank you for allowing [place] to be a part of your care.      Sabas Sous, MD 03/06/23 [phone]

## 2023-03-06 NOTE — Discharge Instructions (Signed)
You were evaluated in the Emergency Department and after careful evaluation, we did not find any emergent condition requiring admission or further testing in the hospital.  Your exam/testing today is overall reassuring.  CT scan did not show any significant injuries.  Symptoms likely due to a concussion.  Recommend mental and physical rest for the next 2 days as we discussed.  Please return to the Emergency Department if you experience any worsening of your condition.   Thank you for allowing [place] to be a part of your care.

## 2023-03-06 NOTE — ED Triage Notes (Signed)
Patient presents with nausea, headache and "some lightheadedness" since striking head on "semi truck frame" approx 3 hours ago while at work. States "they made me come get checked out". Denies loc.

## 2023-05-23 ENCOUNTER — Emergency Department (HOSPITAL_BASED_OUTPATIENT_CLINIC_OR_DEPARTMENT_OTHER): Payer: 59 | Admitting: Radiology

## 2023-05-23 ENCOUNTER — Emergency Department (HOSPITAL_BASED_OUTPATIENT_CLINIC_OR_DEPARTMENT_OTHER)
Admission: EM | Admit: 2023-05-23 | Discharge: 2023-05-23 | Disposition: A | Discharge status: Home / Self Care | Payer: 59 | Attending: Emergency Medicine | Admitting: Emergency Medicine

## 2023-05-23 ENCOUNTER — Other Ambulatory Visit: Payer: Self-pay

## 2023-05-23 ENCOUNTER — Encounter (HOSPITAL_BASED_OUTPATIENT_CLINIC_OR_DEPARTMENT_OTHER): Payer: Self-pay

## 2023-05-23 DIAGNOSIS — W228XXA Striking against or struck by other objects, initial encounter: Secondary | ICD-10-CM | POA: Diagnosis not present

## 2023-05-23 DIAGNOSIS — S59902A Unspecified injury of left elbow, initial encounter: Secondary | ICD-10-CM | POA: Diagnosis present

## 2023-05-23 DIAGNOSIS — Y990 Civilian activity done for income or pay: Secondary | ICD-10-CM | POA: Diagnosis not present

## 2023-05-23 DIAGNOSIS — R202 Paresthesia of skin: Secondary | ICD-10-CM | POA: Diagnosis not present

## 2023-05-23 NOTE — ED Provider Notes (Signed)
 [place] EMERGENCY DEPARTMENT AT [facility] Provider Note   CSN: [id number] Arrival date & time: 05/23/23  1737     History Chief Complaint  Patient presents with   Arm Injury    Craig Pearson is a 23 y.o. male.  Patient presents the emergency department concerns of an arm injury.  He reports that he was at work yesterday when he injured his left arm striking his elbow against a vehicle.  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  Pain has been persistent but now endorsing some tingling and numbness in the fourth and fifth digits of the left hand.  Able to close his fist and grasp things but the sensation is altered.   Arm Injury      Home Medications Prior to Admission medications   Medication Sig Start Date End Date Taking? Authorizing Provider  cetirizine (ZYRTEC ALLERGY) 10 MG tablet Take 1 tablet (10 mg total) by mouth at bedtime. 01/05/22 05/23/23 Yes Theadora Rama Scales, PA-C  ibuprofen (ADVIL) 600 MG tablet Take 600 mg by mouth every 6 (six) hours as needed for moderate pain (pain score 4-6).   Yes [provider]  benzonatate (TESSALON) 100 MG capsule Take 1 capsule (100 mg total) by mouth every 8 (eight) hours. 02/16/22   Renne Crigler, PA-C  cephALEXin (KEFLEX) 500 MG capsule Take 1 capsule (500 mg total) by mouth 4 (four) times daily. 07/24/22   Geoffery Lyons, MD  famotidine (PEPCID) 20 MG tablet Take 1 tablet (20 mg total) by mouth daily. 05/17/20 06/16/20  Estelle June, NP  fluticasone (FLONASE) 50 MCG/ACT nasal spray Place 1 spray into both nostrils daily. Begin by using 2 sprays in each nare daily for 3 to 5 days, then decrease to 1 spray in each nare daily. 01/05/22   Theadora Rama Scales, PA-C  HYDROcodone-acetaminophen (NORCO/VICODIN) 5-325 MG tablet Take 1 tablet by mouth every 6 (six) hours as needed for severe pain. 09/29/22   Pollyann Savoy, MD  montelukast (SINGULAIR) 10 MG tablet Take 1 tablet (10 mg total) by mouth at  bedtime. 01/05/22 04/05/22  Theadora Rama Scales, PA-C      Allergies    Patient has no known allergies.    Review of Systems   Review of Systems  Musculoskeletal:        Elbow pain  All other systems reviewed and are negative.   Physical Exam Updated Vital Signs BP 120/80 (BP Location: Right Arm)   Pulse 94   Temp 98.1 F (36.7 C) (Oral)   Resp 17   Ht 6' (1.829 m)   Wt 97.5 kg   SpO2 99%   BMI 29.16 kg/m  Physical Exam Vitals and nursing note reviewed.  Constitutional:      General: He is not in acute distress.    Appearance: He is well-developed.  HENT:     Head: Normocephalic and atraumatic.  Eyes:     Conjunctiva/sclera: Conjunctivae normal.  Cardiovascular:     Rate and Rhythm: Normal rate and regular rhythm.     Heart sounds: No murmur heard. Pulmonary:     Effort: Pulmonary effort is normal. No respiratory distress.     Breath sounds: Normal breath sounds.  Abdominal:     Palpations: Abdomen is soft.     Tenderness: There is no abdominal tenderness.  Musculoskeletal:        General: Tenderness and signs of injury present. No swelling or deformity. Normal range of motion.  Arms:     Cervical back: Neck supple.  Skin:    General: Skin is warm and dry.     Capillary Refill: Capillary refill takes less than 2 seconds.  Neurological:     Mental Status: He is alert.     Comments: Grip strength and motor function intact of the left arm from the shoulder through the distal fingertips.  There is some diminished sensation throughout the fourth and fifth digits of the left hand.  Tinel's over the cubital tunnel worsens the symptoms.  Psychiatric:        Mood and Affect: Mood normal.     ED Results / Procedures / Treatments   Labs (all labs ordered are listed, but only abnormal results are displayed) Labs Reviewed - No data to display  EKG None  Radiology DG Elbow Complete Left Result Date: 05/23/2023 CLINICAL DATA:  Pain after injury EXAM: LEFT  ELBOW - COMPLETE 4 VIEW COMPARISON:  None Available. FINDINGS: There is no evidence of fracture, dislocation, or joint effusion. There is no evidence of arthropathy or other focal bone abnormality. Soft tissues are unremarkable. IMPRESSION: No acute osseous abnormality. Electronically Signed   By: Karen Kays M.D.   On: 05/23/2023 19:00    Procedures Procedures    Medications Ordered in ED Medications - No data to display  ED Course/ Medical Decision Making/ A&P                                 Medical Decision Making Amount and/or Complexity of Data Reviewed Radiology: ordered.   This patient presents to the ED for concern of arm injury.  Differential diagnosis includes elbow fracture, dislocation, olecranon fracture, nerve injury, paresthesia   Imaging Studies ordered:  I ordered imaging studies including x-ray of the left elbow I independently visualized and interpreted imaging which showed no acute findings I agree with the radiologist interpretation    Problem List / ED Course:  Patient presents to the emergency department concerns of left elbow pain.  Reports that he struck his left elbow while he was at work.  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  He reports some worsening pain with tingling and numbness in the fourth and fifth digits of the left hand.  The first second and third digits although with some mild tingling but not nearly as bad as the other digits.  He denies any inability to move his hand, wrist, or elbow although there is some pain with flexion extension of the elbow.  No significant bruising no puncture to the area. On exam, physical exam is reassuring with intact grip strength and resisted strength testing close to baseline.  Some worsening pain with Tinel's over the cubital tunnel of the left elbow.  Likely indicating a mild traumatic injury to the nerves over this area.  Advised patient to continue to use cold compresses for pain and  swelling as well as over-the-counter medication such as Tylenol ibuprofen for pain.  Encourage some rest of the area but continued mobility to prevent overt stiffening.  Advised patient to follow-up with his PCP for repeat evaluation to ensure symptoms are improving.  No other acute or focal concerns at this time.  Patient is otherwise neurovascularly intact.  Patient discharged home in stable condition.  Final Clinical Impression(s) / ED Diagnoses Final diagnoses:  Arm paresthesia, left  Elbow injury, left, initial encounter  Rx / DC Orders ED Discharge Orders     None         Smitty Knudsen, PA-C 05/23/23 2050    Franne Forts, DO 05/24/23 2017

## 2023-05-23 NOTE — ED Triage Notes (Signed)
 In for eval of left arm pain. Hit left arm just above elbow on some engine part yesterday at approx 1600. Reports numbness to 4&5th fingers since injury, numbness noted to 2nd &3rd fingers. Happened at work.

## 2023-05-23 NOTE — Discharge Instructions (Signed)
 You were seen in the emergency department today for concerns of an elbow injury.  Your x-ray imaging was thankfully negative for any acute findings.  I do suspect likely a mild nerve injury given the tingling and numbness that you are feeling throughout the left arm into the hand.  This will likely improve on its own but will take some time.  I would recommend continue to ice the elbow to help reduce the swelling in the area.  You may take Tylenol ibuprofen for pain and swelling as well.  For any concerns of new or worsening symptoms, return to the emergency department.

## 2023-08-07 ENCOUNTER — Encounter (HOSPITAL_BASED_OUTPATIENT_CLINIC_OR_DEPARTMENT_OTHER): Payer: Self-pay | Admitting: Emergency Medicine

## 2023-08-07 ENCOUNTER — Emergency Department (HOSPITAL_BASED_OUTPATIENT_CLINIC_OR_DEPARTMENT_OTHER)
Admission: EM | Admit: 2023-08-07 | Discharge: 2023-08-07 | Disposition: A | Discharge status: Home / Self Care | Payer: Worker's Compensation | Attending: Emergency Medicine | Admitting: Emergency Medicine

## 2023-08-07 ENCOUNTER — Other Ambulatory Visit: Payer: Self-pay

## 2023-08-07 ENCOUNTER — Emergency Department (HOSPITAL_BASED_OUTPATIENT_CLINIC_OR_DEPARTMENT_OTHER)

## 2023-08-07 DIAGNOSIS — T148XXA Other injury of unspecified body region, initial encounter: Secondary | ICD-10-CM

## 2023-08-07 DIAGNOSIS — S20219A Contusion of unspecified front wall of thorax, initial encounter: Secondary | ICD-10-CM

## 2023-08-07 DIAGNOSIS — Y990 Civilian activity done for income or pay: Secondary | ICD-10-CM | POA: Diagnosis not present

## 2023-08-07 DIAGNOSIS — S29001A Unspecified injury of muscle and tendon of front wall of thorax, initial encounter: Secondary | ICD-10-CM | POA: Diagnosis present

## 2023-08-07 DIAGNOSIS — S20211A Contusion of right front wall of thorax, initial encounter: Secondary | ICD-10-CM | POA: Diagnosis not present

## 2023-08-07 DIAGNOSIS — W231XXA Caught, crushed, jammed, or pinched between stationary objects, initial encounter: Secondary | ICD-10-CM | POA: Diagnosis not present

## 2023-08-07 LAB — CBC WITH DIFFERENTIAL/PLATELET
Abs Immature Granulocytes: 0.02 10*3/uL (ref 0.00–0.07)
Basophils Absolute: 0 10*3/uL (ref 0.0–0.1)
Basophils Relative: 1 %
Eosinophils Absolute: 0.1 10*3/uL (ref 0.0–0.5)
Eosinophils Relative: 1 %
HCT: 44.9 % (ref 39.0–52.0)
Hemoglobin: 16.1 g/dL (ref 13.0–17.0)
Immature Granulocytes: 0 %
Lymphocytes Relative: 17 %
Lymphs Abs: 1.2 10*3/uL (ref 0.7–4.0)
MCH: 29.2 pg (ref 26.0–34.0)
MCHC: 35.9 g/dL (ref 30.0–36.0)
MCV: 81.5 fL (ref 80.0–100.0)
Monocytes Absolute: 0.6 10*3/uL (ref 0.1–1.0)
Monocytes Relative: 8 %
Neutro Abs: 5.1 10*3/uL (ref 1.7–7.7)
Neutrophils Relative %: 73 %
Platelets: 221 10*3/uL (ref 150–400)
RBC: 5.51 MIL/uL (ref 4.22–5.81)
RDW: 12.1 % (ref 11.5–15.5)
WBC: 7 10*3/uL (ref 4.0–10.5)
nRBC: 0 % (ref 0.0–0.2)

## 2023-08-07 LAB — COMPREHENSIVE METABOLIC PANEL WITH GFR
ALT: 45 U/L — ABNORMAL HIGH (ref 0–44)
AST: 35 U/L (ref 15–41)
Albumin: 4.7 g/dL (ref 3.5–5.0)
Alkaline Phosphatase: 87 U/L (ref 38–126)
Anion gap: 11 (ref 5–15)
BUN: 12 mg/dL (ref 6–20)
CO2: 26 mmol/L (ref 22–32)
Calcium: 9.9 mg/dL (ref 8.9–10.3)
Chloride: 103 mmol/L (ref 98–111)
Creatinine, Ser: 0.75 mg/dL (ref 0.61–1.24)
GFR, Estimated: >60 mL/min (ref >60)
Glucose, Bld: 87 mg/dL (ref 70–99)
Potassium: 4.2 mmol/L (ref 3.5–5.1)
Sodium: 139 mmol/L (ref 135–145)
Total Bilirubin: 0.7 mg/dL (ref 0.0–1.2)
Total Protein: 7 g/dL (ref 6.5–8.1)

## 2023-08-07 LAB — TROPONIN T, HIGH SENSITIVITY: Troponin T High Sensitivity: <15 ng/L (ref <19)

## 2023-08-07 LAB — CK: Total CK: 103 U/L (ref 49–397)

## 2023-08-07 MED ORDER — OXYCODONE-ACETAMINOPHEN 5-325 MG PO TABS
1.0000 | ORAL_TABLET | Freq: Once | ORAL | Status: AC
  Administered 2023-08-07: 1 via ORAL
  Filled 2023-08-07: qty 1

## 2023-08-07 MED ORDER — DIAZEPAM 5 MG PO TABS
5.0000 mg | ORAL_TABLET | Freq: Once | ORAL | Status: AC
  Administered 2023-08-07: 5 mg via ORAL
  Filled 2023-08-07: qty 1

## 2023-08-07 MED ORDER — METHOCARBAMOL 500 MG PO TABS: 500.0000 mg | ORAL_TABLET | Freq: Two times a day (BID) | ORAL | 0 refills | Status: DC

## 2023-08-07 MED ORDER — NAPROXEN 500 MG PO TABS: 500.0000 mg | ORAL_TABLET | Freq: Two times a day (BID) | ORAL | 0 refills | Status: DC

## 2023-08-07 MED ORDER — OXYCODONE-ACETAMINOPHEN 5-325 MG PO TABS
1.0000 | ORAL_TABLET | Freq: Three times a day (TID) | ORAL | 0 refills | Status: AC | PRN
Start: 2023-08-07 — End: 2023-08-10

## 2023-08-07 MED ORDER — IOHEXOL 300 MG/ML  SOLN
100.0000 mL | Freq: Once | INTRAMUSCULAR | Status: AC | PRN
  Administered 2023-08-07: 100 mL via INTRAVENOUS

## 2023-08-07 MED ORDER — MORPHINE SULFATE (PF) 4 MG/ML IV SOLN
8.0000 mg | Freq: Once | INTRAVENOUS | Status: AC
  Administered 2023-08-07: 8 mg via INTRAVENOUS
  Filled 2023-08-07: qty 2

## 2023-08-07 MED ORDER — NAPROXEN 250 MG PO TABS
500.0000 mg | ORAL_TABLET | Freq: Once | ORAL | Status: AC
  Administered 2023-08-07: 500 mg via ORAL
  Filled 2023-08-07: qty 2

## 2023-08-07 NOTE — Discharge Instructions (Addendum)
 You are seen in the ER after sustaining crush injury.  The workup in the emergency room is reassuring.  No internal bleeding, fractures, organ injury.  Please use the incentive spirometer routinely to prevent complications such as pneumonia.  Take the medications prescribed for symptom management.

## 2023-08-07 NOTE — ED Notes (Signed)
 RT in to assess patient. Breath sounds heard bilaterally, right lung slightly diminished. Patient placed on cardiac monitor. EDP at bedside.

## 2023-08-07 NOTE — ED Provider Notes (Signed)
 [place] EMERGENCY DEPARTMENT AT [facility] Provider Note   CSN: [id number] Arrival date & time: 08/07/23  1845     History  Chief Complaint  Patient presents with   Trauma    Craig Pearson is a 23 y.o. male.  HPI    23 year old male comes in with chief complaint of trauma.  Patient has no concerning past medical history.  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  The truck was on a jack.  He had his body between the tires and the cab, when the jack gave out and he got pinned between the tire and the cab.  Patient was stuck for about 2 minutes.  His colleagues were able to re-jaak the cabin got a mild.  Patient is complaining of severe pain over his back in the chest.  Pain is worse with breathing.  He denies any injury to his extremities and head, neck.  There was no loss of consciousness.  Patient comes to the ER POV straight after the injury.  Home Medications Prior to Admission medications   Medication Sig Start Date End Date Taking? Authorizing Provider  methocarbamol (ROBAXIN) 500 MG tablet Take 1 tablet (500 mg total) by mouth 2 (two) times daily. 08/07/23  Yes Arcenio Mullaly, MD  naproxen (NAPROSYN) 500 MG tablet Take 1 tablet (500 mg total) by mouth 2 (two) times daily. 08/07/23  Yes Deatra Face, MD  benzonatate  (TESSALON ) 100 MG capsule Take 1 capsule (100 mg total) by mouth every 8 (eight) hours. 02/16/22   Geiple, Joshua, PA-C  cephALEXin  (KEFLEX ) 500 MG capsule Take 1 capsule (500 mg total) by mouth 4 (four) times daily. 07/24/22   Orvilla Blander, MD  cetirizine  (ZYRTEC  ALLERGY ) 10 MG tablet Take 1 tablet (10 mg total) by mouth at bedtime. 01/05/22 05/23/23  Eloise Hake Scales, PA-C  famotidine  (PEPCID ) 20 MG tablet Take 1 tablet (20 mg total) by mouth daily. 05/17/20 06/16/20  Rayann Cage, NP  fluticasone  (FLONASE ) 50 MCG/ACT nasal spray Place 1 spray into both nostrils daily. Begin by using 2 sprays in each nare daily for 3 to 5  days, then decrease to 1 spray in each nare daily. 01/05/22   Eloise Hake Scales, PA-C  HYDROcodone -acetaminophen  (NORCO/VICODIN) 5-325 MG tablet Take 1 tablet by mouth every 6 (six) hours as needed for severe pain. 09/29/22   Charmayne Cooper, MD  ibuprofen  (ADVIL ) 600 MG tablet Take 600 mg by mouth every 6 (six) hours as needed for moderate pain (pain score 4-6).    [provider]  montelukast  (SINGULAIR ) 10 MG tablet Take 1 tablet (10 mg total) by mouth at bedtime. 01/05/22 04/05/22  Eloise Hake Scales, PA-C      Allergies    Patient has no known allergies.    Review of Systems   Review of Systems  All other systems reviewed and are negative.   Physical Exam Updated Vital Signs BP 130/71   Pulse 79   Temp 98.9 F (37.2 C)   Resp 18   SpO2 98%  Physical Exam Vitals and nursing note reviewed.  Constitutional:      Appearance: He is well-developed.  HENT:     Head: Atraumatic.  Cardiovascular:     Rate and Rhythm: Normal rate.  Pulmonary:     Effort: Pulmonary effort is normal.     Breath sounds: Normal breath sounds.  Musculoskeletal:     Cervical back: Neck supple.     Comments:  Patient has tenderness over the right chest wall, midsternal chest wall, back -between the shoulder blades.  Patient has bruising over the back as well  Skin:    General: Skin is warm.  Neurological:     Mental Status: He is alert and oriented to person, place, and time.     ED Results / Procedures / Treatments   Labs (all labs ordered are listed, but only abnormal results are displayed) Labs Reviewed  COMPREHENSIVE METABOLIC PANEL WITH GFR - Abnormal; Notable for the following components:      Result Value   ALT 45 (*)    All other components within normal limits  CBC WITH DIFFERENTIAL/PLATELET  CK  TROPONIN T, HIGH SENSITIVITY    EKG None  Date: 08/07/2023  Rate: 87  Rhythm: normal sinus rhythm  QRS Axis: normal  Intervals: normal  ST/T Wave abnormalities:  normal  Conduction Disutrbances: none  Narrative Interpretation: unremarkable     Radiology CT CHEST ABDOMEN PELVIS W CONTRAST Result Date: 08/07/2023 CLINICAL DATA:  Polytrauma, blunt Semi truck cab fell onto patient. Pinned in wheel-well- pinned between wheel and truck. Bruising to front and back torso. Painful deep respirations. EXAM: CT CHEST, ABDOMEN, AND PELVIS WITH CONTRAST TECHNIQUE: Multidetector CT imaging of the chest, abdomen and pelvis was performed following the standard protocol during bolus administration of intravenous contrast. RADIATION DOSE REDUCTION: This exam was performed according to the departmental dose-optimization program which includes automated exposure control, adjustment of the mA and/or kV according to patient size and/or use of iterative reconstruction technique. CONTRAST:  100mL OMNIPAQUE IOHEXOL 300 MG/ML  SOLN COMPARISON:  CT abdomen pelvis 07/05/2017. FINDINGS: CHEST: Cardiovascular: No aortic injury. The thoracic aorta is normal in caliber. The heart is normal in size. No significant pericardial effusion. Mediastinum/Nodes: Residual anterior mediastinal thymus. No pneumomediastinum. No mediastinal hematoma. The esophagus is unremarkable. The thyroid is unremarkable. The central airways are patent. No mediastinal, hilar, or axillary lymphadenopathy. Lungs/Pleura: No focal consolidation. No pulmonary nodule. No pulmonary mass. No pulmonary contusion or laceration. No pneumatocele formation. No pleural effusion. No pneumothorax. No hemothorax. Musculoskeletal/Chest wall: No chest wall mass.  Bilateral trace gynecomastia. No acute rib or sternal fracture. No spinal fracture. ABDOMEN / PELVIS: Hepatobiliary: Not enlarged. No focal lesion. No laceration or subcapsular hematoma. Cholecystectomy.  No biliary ductal dilatation. Pancreas: Normal pancreatic contour. No main pancreatic duct dilatation. Spleen: Not enlarged. No focal lesion. No laceration, subcapsular hematoma, or  vascular injury. Adrenals/Urinary Tract: No nodularity bilaterally. Bilateral kidneys enhance symmetrically. No hydronephrosis. No contusion, laceration, or subcapsular hematoma. No injury to the vascular structures or collecting systems. No hydroureter. The urinary bladder is unremarkable. On delayed imaging, there is no urothelial wall thickening and there are no filling defects in the opacified portions of the bilateral collecting systems or ureters. Stomach/Bowel: No small or large bowel wall thickening or dilatation. Under distended rectum. The appendix is unremarkable. Vasculature/Lymphatics: No abdominal aorta or iliac aneurysm. No active contrast extravasation or pseudoaneurysm. No abdominal, pelvic, inguinal lymphadenopathy. Reproductive: Normal. Other: No simple free fluid ascites. No pneumoperitoneum. No hemoperitoneum. No mesenteric hematoma identified. No organized fluid collection. Musculoskeletal: No significant soft tissue hematoma. No acute pelvic fracture. No spinal fracture. Other ports and devices: None. IMPRESSION: 1. No acute intrathoracic, intra-abdominal, intrapelvic traumatic injury. 2. No acute fracture or traumatic malalignment of the thoracic or lumbar spine. Electronically Signed   By: Morgane  Naveau M.D.   On: 08/07/2023 20:45   DG Chest Port 1 View Result Date: 08/07/2023 CLINICAL DATA:  Crash injury.  Chest and back pain. EXAM: PORTABLE CHEST 1 VIEW COMPARISON:  Chest radiograph dated 11/08/2022. FINDINGS: The heart size and mediastinal contours are within normal limits. Both lungs are clear. The visualized skeletal structures are unremarkable. IMPRESSION: No active disease. Electronically Signed   By: Angus Bark M.D.   On: 08/07/2023 19:43    Procedures Procedures    Medications Ordered in ED Medications  oxyCODONE-acetaminophen  (PERCOCET/ROXICET) 5-325 MG per tablet 1 tablet (has no administration in time range)  naproxen (NAPROSYN) tablet 500 mg (has no  administration in time range)  diazepam (VALIUM) tablet 5 mg (has no administration in time range)  morphine (PF) 4 MG/ML injection 8 mg (8 mg Intravenous Given 08/07/23 1941)  iohexol (OMNIPAQUE) 300 MG/ML solution 100 mL (100 mLs Intravenous Contrast Given 08/07/23 2026)    ED Course/ Medical Decision Making/ A&P                                 Medical Decision Making Amount and/or Complexity of Data Reviewed Labs: ordered. Radiology: ordered.  Risk Prescription drug management.   Patient with no significant medical, surgical history coming in after being crushed between tire and cab Of his 16 wheeler. History and clinical exam is significant for chest and back pain.  No head trauma, no neck pain, no neurologic complaints.  Differential diagnosis considered for this patient includes: Fractures - spine, long bones, ribs, facial Pneumothorax Chest contusion (lung and chest wall) Traumatic myocarditis/cardiac contusion Solid organ injury/bleed/laceration (liver/kidney/spleen) Perforated viscus Multiple contusions Vascular injuries (dissection/hematoma)  Based on our initial assessment, we will get following workup: Basic labs, troponin, chest x-ray, CT blunt trauma protocol. If the workup is negative no further concerns from trauma perspective.   Reassessment: I have independently interpreted the following imaging: X-ray of the chest There is no evidence of pneumothorax.  Patient is now complaining of epigastric pain and swelling.  I have requested that CT be done stat without waiting for creatinine.  10:11 PM The patient appears reasonably screened and/or stabilized for discharge and I doubt any other medical condition or other [facility] requiring further screening, evaluation, or treatment in the ED at this time prior to discharge.   Results from the ER workup discussed with the patient face to face and all questions answered to the best of my ability. The patient is safe for  discharge with strict return precautions.  Final Clinical Impression(s) / ED Diagnoses Final diagnoses:  Contusion of chest wall, unspecified laterality, initial encounter  Crush injury    Rx / DC Orders ED Discharge Orders          Ordered    naproxen (NAPROSYN) 500 MG tablet  2 times daily        08/07/23 2210    methocarbamol (ROBAXIN) 500 MG tablet  2 times daily        08/07/23 2210              Deatra Face, MD 08/07/23 2211

## 2023-08-07 NOTE — ED Triage Notes (Signed)
 Semi truck cab fell onto patient. Pinned in wheel-well- pinned between wheel and truck. Bruising to front and back torso. Painful deep respirations.

## 2023-09-13 ENCOUNTER — Telehealth: Admitting: Physician Assistant

## 2023-09-13 DIAGNOSIS — J069 Acute upper respiratory infection, unspecified: Secondary | ICD-10-CM

## 2023-09-13 NOTE — Progress Notes (Signed)
 I have spent 5 minutes in review of e-visit questionnaire, review and updating patient chart, medical decision making and response to patient.   Piedad Climes, PA-C

## 2023-09-13 NOTE — Progress Notes (Signed)
 Message sent to patient requesting further input regarding current symptoms. Awaiting patient response.

## 2023-09-13 NOTE — Progress Notes (Signed)
 E-Visit for Upper Respiratory Infection  ? ?We are sorry you are not feeling well.  Here is how we plan to help! ? ?Based on what you have shared with me, it looks like you may have a viral upper respiratory infection.  Upper respiratory infections are caused by a large number of viruses; however, rhinovirus is the most common cause.  ? ?Symptoms vary from person to person, with common symptoms including sore throat, cough, fatigue or lack of energy and feeling of general discomfort.  A low-grade fever of up to 100.4 may present, but is often uncommon.  Symptoms vary however, and are closely related to a person's age or underlying illnesses.  The most common symptoms associated with an upper respiratory infection are nasal discharge or congestion, cough, sneezing, headache and pressure in the ears and face.  These symptoms usually persist for about 3 to 10 days, but can last up to 2 weeks.  It is important to know that upper respiratory infections do not cause serious illness or complications in most cases.   ? ?Upper respiratory infections can be transmitted from person to person, with the most common method of transmission being a person's hands.  The virus is able to live on the skin and can infect other persons for up to 2 hours after direct contact.  Also, these can be transmitted when someone coughs or sneezes; thus, it is important to cover the mouth to reduce this risk.  To keep the spread of the illness at bay, good hand hygiene is very important. ? ?This is an infection that is most likely caused by a virus. There are no specific treatments other than to help you with the symptoms until the infection runs its course.  We are sorry you are not feeling well.  Here is how we plan to help! ? ? ?For nasal congestion, you may use an oral decongestants such as Mucinex D or if you have glaucoma or high blood pressure use plain Mucinex.  Saline nasal spray or nasal drops can help and can safely be used as often as  needed for congestion.  ? ?If you do not have a history of heart disease, hypertension, diabetes or thyroid disease, prostate/bladder issues or glaucoma, you may also use Sudafed to treat nasal congestion.  It is highly recommended that you consult with a pharmacist or your primary care physician to ensure this medication is safe for you to take.    ? ?If you have a cough, you may use cough suppressants such as Delsym and Robitussin.  If you have glaucoma or high blood pressure, you can also use Coricidin HBP.   ? ? ?If you have a sore or scratchy throat, use a saltwater gargle- ? to ? teaspoon of salt dissolved in a 4-ounce to 8-ounce glass of warm water.  Gargle the solution for approximately 15-30 seconds and then spit.  It is important not to swallow the solution.  You can also use throat lozenges/cough drops and Chloraseptic spray to help with throat pain or discomfort.  Warm or cold liquids can also be helpful in relieving throat pain. ? ?For headache, pain or general discomfort, you can use Ibuprofen or Tylenol as directed.   ?Some authorities believe that zinc sprays or the use of Echinacea may shorten the course of your symptoms. ? ? ?HOME CARE ?Only take medications as instructed by your medical team. ?Be sure to drink plenty of fluids. Water is fine as well as fruit juices, sodas  and electrolyte beverages. You may want to stay away from caffeine or alcohol. If you are nauseated, try taking small sips of liquids. How do you know if you are getting enough fluid? Your urine should be a pale yellow or almost colorless. ?Get rest. ?Taking a steamy shower or using a humidifier may help nasal congestion and ease sore throat pain. You can place a towel over your head and breathe in the steam from hot water coming from a faucet. ?Using a saline nasal spray works much the same way. ?Cough drops, hard candies and sore throat lozenges may ease your cough. ?Avoid close contacts especially the very young and the  elderly ?Cover your mouth if you cough or sneeze ?Always remember to wash your hands.  ? ?GET HELP RIGHT AWAY IF: ?You develop worsening fever. ?If your symptoms do not improve within 10 days ?You develop yellow or green discharge from your nose over 3 days. ?You have coughing fits ?You develop a severe head ache or visual changes. ?You develop shortness of breath, difficulty breathing or start having chest pain ?Your symptoms persist after you have completed your treatment plan ? ?MAKE SURE YOU  ?Understand these instructions. ?Will watch your condition. ?Will get help right away if you are not doing well or get worse. ? ?Thank you for choosing an e-visit. ? ?Your e-visit answers were reviewed by a board certified advanced clinical practitioner to complete your personal care plan. Depending upon the condition, your plan could have included both over the counter or prescription medications. ? ?Please review your pharmacy choice. Make sure the pharmacy is open so you can pick up prescription now. If there is a problem, you may contact your provider through [employer] and have the prescription routed to another pharmacy.  Your safety is important to [place]. If you have drug allergies check your prescription carefully.  ? ?For the next 24 hours you can use MyChart to ask questions about today's visit, request a non-urgent call back, or ask for a work or school excuse. ?You will get an email in the next two days asking about your experience. I hope that your e-visit has been valuable and will speed your recovery. ? ? ? ? ?

## 2023-09-14 ENCOUNTER — Other Ambulatory Visit: Payer: Self-pay

## 2023-09-14 ENCOUNTER — Encounter (HOSPITAL_BASED_OUTPATIENT_CLINIC_OR_DEPARTMENT_OTHER): Payer: Self-pay

## 2023-09-14 DIAGNOSIS — R1031 Right lower quadrant pain: Secondary | ICD-10-CM | POA: Diagnosis present

## 2023-09-14 DIAGNOSIS — R1084 Generalized abdominal pain: Secondary | ICD-10-CM | POA: Diagnosis not present

## 2023-09-14 NOTE — ED Triage Notes (Signed)
 Pt presents c/o x2 days of LLQ pain that goes into RLQ, worsening. Has been on abx for URI. +N/+V/+D. Blurry vision.

## 2023-09-15 ENCOUNTER — Emergency Department (HOSPITAL_BASED_OUTPATIENT_CLINIC_OR_DEPARTMENT_OTHER)
Admission: EM | Admit: 2023-09-15 | Discharge: 2023-09-15 | Disposition: A | Discharge status: Home / Self Care | Attending: Emergency Medicine | Admitting: Emergency Medicine

## 2023-09-15 ENCOUNTER — Emergency Department (HOSPITAL_BASED_OUTPATIENT_CLINIC_OR_DEPARTMENT_OTHER)

## 2023-09-15 DIAGNOSIS — R1084 Generalized abdominal pain: Secondary | ICD-10-CM | POA: Diagnosis not present

## 2023-09-15 LAB — CBC
HCT: 46.2 % (ref 39.0–52.0)
Hemoglobin: 16.4 g/dL (ref 13.0–17.0)
MCH: 29.1 pg (ref 26.0–34.0)
MCHC: 35.5 g/dL (ref 30.0–36.0)
MCV: 81.9 fL (ref 80.0–100.0)
Platelets: 206 10*3/uL (ref 150–400)
RBC: 5.64 MIL/uL (ref 4.22–5.81)
RDW: 12.1 % (ref 11.5–15.5)
WBC: 6 10*3/uL (ref 4.0–10.5)
nRBC: 0 % (ref 0.0–0.2)

## 2023-09-15 LAB — COMPREHENSIVE METABOLIC PANEL WITH GFR
ALT: 46 U/L — ABNORMAL HIGH (ref 0–44)
AST: 34 U/L (ref 15–41)
Albumin: 4.7 g/dL (ref 3.5–5.0)
Alkaline Phosphatase: 97 U/L (ref 38–126)
Anion gap: 12 (ref 5–15)
BUN: 12 mg/dL (ref 6–20)
CO2: 26 mmol/L (ref 22–32)
Calcium: 10 mg/dL (ref 8.9–10.3)
Chloride: 100 mmol/L (ref 98–111)
Creatinine, Ser: 0.78 mg/dL (ref 0.61–1.24)
GFR, Estimated: >60 mL/min (ref >60)
Glucose, Bld: 92 mg/dL (ref 70–99)
Potassium: 4.1 mmol/L (ref 3.5–5.1)
Sodium: 138 mmol/L (ref 135–145)
Total Bilirubin: 0.7 mg/dL (ref 0.0–1.2)
Total Protein: 7.4 g/dL (ref 6.5–8.1)

## 2023-09-15 LAB — URINALYSIS, ROUTINE W REFLEX MICROSCOPIC
Bilirubin Urine: NEGATIVE
Glucose, UA: NEGATIVE mg/dL
Hgb urine dipstick: NEGATIVE
Ketones, ur: 40 mg/dL — AB
Leukocytes,Ua: NEGATIVE
Nitrite: NEGATIVE
Specific Gravity, Urine: >1.046 — ABNORMAL HIGH (ref 1.005–1.030)
pH: 6.5 (ref 5.0–8.0)

## 2023-09-15 LAB — LIPASE, BLOOD: Lipase: 28 U/L (ref 11–51)

## 2023-09-15 MED ORDER — LACTATED RINGERS IV BOLUS
1000.0000 mL | Freq: Once | INTRAVENOUS | Status: AC
  Administered 2023-09-15: 1000 mL via INTRAVENOUS

## 2023-09-15 MED ORDER — ONDANSETRON HCL 4 MG PO TABS: 4.0000 mg | ORAL_TABLET | Freq: Four times a day (QID) | ORAL | 0 refills | Status: AC

## 2023-09-15 MED ORDER — ONDANSETRON 4 MG PO TBDP
4.0000 mg | ORAL_TABLET | Freq: Once | ORAL | Status: AC
  Administered 2023-09-15: 4 mg via ORAL
  Filled 2023-09-15: qty 1

## 2023-09-15 MED ORDER — IOHEXOL 300 MG/ML  SOLN
100.0000 mL | Freq: Once | INTRAMUSCULAR | Status: AC | PRN
  Administered 2023-09-15: 100 mL via INTRAVENOUS

## 2023-09-15 NOTE — ED Provider Notes (Signed)
 [place] EMERGENCY DEPARTMENT AT [facility] Provider Note   CSN: [id number] Arrival date & time: 09/14/23  2255     History Chief Complaint  Patient presents with   Abdominal Pain    HPI Craig Pearson is a 23 y.o. male presenting for chief complaint of RLQ pain. Saw PCP for these symptoms. Started on ABX for asthma exacerbation NVD NBNB  Less PO.  Feels some improvement with the zofran .  Patient's recorded medical, surgical, social, medication list and allergies were reviewed in the Snapshot window as part of the initial history.   Review of Systems   Review of Systems  Constitutional:  Negative for chills and fever.  HENT:  Negative for ear pain and sore throat.   Eyes:  Negative for pain and visual disturbance.  Respiratory:  Negative for cough and shortness of breath.   Cardiovascular:  Negative for chest pain and palpitations.  Gastrointestinal:  Positive for diarrhea, nausea and vomiting. Negative for abdominal pain.  Genitourinary:  Negative for dysuria and hematuria.  Musculoskeletal:  Negative for arthralgias and back pain.  Skin:  Negative for color change and rash.  Neurological:  Negative for seizures and syncope.  All other systems reviewed and are negative.   Physical Exam Updated Vital Signs BP 124/67   Pulse 60   Temp 98 F (36.7 C) (Oral)   Resp 18   Ht 6' (1.829 m)   Wt 99.8 kg   SpO2 97%   BMI 29.84 kg/m  Physical Exam Vitals and nursing note reviewed.  Constitutional:      General: He is not in acute distress.    Appearance: He is well-developed.  HENT:     Head: Normocephalic and atraumatic.   Eyes:     Conjunctiva/sclera: Conjunctivae normal.    Cardiovascular:     Rate and Rhythm: Normal rate and regular rhythm.     Heart sounds: No murmur heard. Pulmonary:     Effort: Pulmonary effort is normal. No respiratory distress.     Breath sounds: Normal breath sounds.  Abdominal:     Palpations: Abdomen is soft.      Tenderness: There is abdominal tenderness in the right lower quadrant and periumbilical area.   Musculoskeletal:        General: No swelling.     Cervical back: Neck supple.   Skin:    General: Skin is warm and dry.     Capillary Refill: Capillary refill takes less than 2 seconds.   Neurological:     Mental Status: He is alert.   Psychiatric:        Mood and Affect: Mood normal.      ED Course/ Medical Decision Making/ A&P    Procedures Procedures   Medications Ordered in ED Medications  ondansetron  (ZOFRAN -ODT) disintegrating tablet 4 mg (4 mg Oral Given 09/15/23 0239)  lactated ringers  bolus 1,000 mL (0 mLs Intravenous Stopped 09/15/23 0400)  iohexol  (OMNIPAQUE ) 300 MG/ML solution 100 mL (100 mLs Intravenous Contrast Given 09/15/23 0339)   Medical Decision Making:   Craig Pearson is a 23 y.o. male who presented to the ED today with abdominal pain, detailed above.    Additional history discussed with patient's family/caregivers.  Patient placed on continuous vitals and telemetry monitoring while in ED which was reviewed periodically.  Complete initial physical exam performed, notably the patient  was HDS in NAD.     Reviewed and confirmed nursing documentation for past medical history, family history, social history.  Initial Assessment:   With the patient's presentation of abdominal pain, most likely diagnosis is nonspecific etiology. Other diagnoses were considered including (but not limited to) gastroenteritis, colitis, small bowel obstruction, appendicitis, cholecystitis, pancreatitis, nephrolithiasis, UTI, pyleonephritis. These are considered less likely due to history of present illness and physical exam findings.   This is most consistent with an acute life/limb threatening illness complicated by underlying chronic conditions.   Initial Plan:  CBC/CMP to evaluate for underlying infectious/metabolic etiology for patient's abdominal pain  Lipase to evaluate for  pancreatitis  EKG to evaluate for cardiac source of pain  CTAB/Pelvis with contrast to evaluate for structural/surgical etiology of patients' severe abdominal pain.  Urinalysis and repeat physical assessment to evaluate for UTI/Pyelonpehritis  Empiric management of symptoms with escalating pain control and antiemetics as needed.   Initial Study Results:   Laboratory  All laboratory results reviewed without evidence of clinically relevant pathology.    EKG EKG was reviewed independently. Rate, rhythm, axis, intervals all examined and without medically relevant abnormality. ST segments without concerns for elevations.    Radiology All images reviewed independently. Agree with radiology report at this time.   CT ABDOMEN PELVIS W CONTRAST Result Date: 09/15/2023 CLINICAL DATA:  Left lower quadrant pain radiating to right lower quadrant, worsening symptoms for 2 days EXAM: CT ABDOMEN AND PELVIS WITH CONTRAST TECHNIQUE: Multidetector CT imaging of the abdomen and pelvis was performed using the standard protocol following bolus administration of intravenous contrast. RADIATION DOSE REDUCTION: This exam was performed according to the departmental dose-optimization program which includes automated exposure control, adjustment of the mA and/or kV according to patient size and/or use of iterative reconstruction technique. CONTRAST:  OMNIPAQUE  IOHEXOL  300 MG/ML  SOLN COMPARISON:  08/07/2023 FINDINGS: Lower chest: Patchy left lower lobe airspace disease consistent with inflammation or infection. Hepatobiliary: No focal liver abnormality is seen. Status post cholecystectomy. No biliary dilatation. Pancreas: Unremarkable. No pancreatic ductal dilatation or surrounding inflammatory changes. Spleen: Normal in size without focal abnormality. Adrenals/Urinary Tract: Adrenal glands are unremarkable. Kidneys are normal, without renal calculi, focal lesion, or hydronephrosis. Bladder is decompressed, limiting its  evaluation. Stomach/Bowel: No bowel obstruction or ileus. Normal appendix right lower quadrant. No bowel wall thickening or inflammatory change. Vascular/Lymphatic: No significant vascular findings are present. No enlarged abdominal or pelvic lymph nodes. Reproductive: Prostate is unremarkable. Other: No free fluid or free intraperitoneal gas. No abdominal wall hernia. Musculoskeletal: No acute or destructive bony abnormalities. Reconstructed images demonstrate no additional findings. IMPRESSION: 1. No acute intra-abdominal or intrapelvic process. Normal appendix. Electronically Signed   By: Ozell Daring M.D.   On: 09/15/2023 03:51    Final Reassessment and Plan:   CT scan nondiagnostic.  Likely viral syndrome given diffuse involvement of upper respiratory symptoms, GI symptoms. He is in no acute distress symptomatically improved after antiemetics. He is ambulatory tolerating p.o. intake at this time.  Strict return precautions, clinical overlap with early appendicitis reinforced and patient expressed understanding.     Clinical Impression:  1. Generalized abdominal pain      Discharge   Final Clinical Impression(s) / ED Diagnoses Final diagnoses:  Generalized abdominal pain    Rx / DC Orders ED Discharge Orders          Ordered    ondansetron  (ZOFRAN ) 4 MG tablet  Every 6 hours        09/15/23 0441              Jerral Meth, MD 09/15/23 [phone]

## 2023-10-04 ENCOUNTER — Encounter

## 2024-02-14 ENCOUNTER — Encounter (HOSPITAL_BASED_OUTPATIENT_CLINIC_OR_DEPARTMENT_OTHER): Payer: Self-pay | Admitting: Orthopedic Surgery

## 2024-02-14 ENCOUNTER — Other Ambulatory Visit: Payer: Self-pay

## 2024-02-20 ENCOUNTER — Ambulatory Visit (HOSPITAL_BASED_OUTPATIENT_CLINIC_OR_DEPARTMENT_OTHER): Admitting: Anesthesiology

## 2024-02-20 ENCOUNTER — Other Ambulatory Visit: Payer: Self-pay

## 2024-02-20 ENCOUNTER — Ambulatory Visit (HOSPITAL_BASED_OUTPATIENT_CLINIC_OR_DEPARTMENT_OTHER)
Admission: RE | Admit: 2024-02-20 | Discharge: 2024-02-20 | Disposition: A | Discharge status: Home / Self Care | Attending: Orthopedic Surgery | Admitting: Orthopedic Surgery

## 2024-02-20 ENCOUNTER — Encounter (HOSPITAL_BASED_OUTPATIENT_CLINIC_OR_DEPARTMENT_OTHER): Payer: Self-pay | Admitting: Orthopedic Surgery

## 2024-02-20 ENCOUNTER — Encounter (HOSPITAL_BASED_OUTPATIENT_CLINIC_OR_DEPARTMENT_OTHER)
Admission: RE | Disposition: A | Discharge status: Home / Self Care | Payer: Self-pay | Source: Home / Self Care | Attending: Orthopedic Surgery

## 2024-02-20 DIAGNOSIS — F32A Depression, unspecified: Secondary | ICD-10-CM | POA: Insufficient documentation

## 2024-02-20 DIAGNOSIS — F419 Anxiety disorder, unspecified: Secondary | ICD-10-CM | POA: Insufficient documentation

## 2024-02-20 DIAGNOSIS — G8929 Other chronic pain: Secondary | ICD-10-CM | POA: Insufficient documentation

## 2024-02-20 DIAGNOSIS — Z791 Long term (current) use of non-steroidal anti-inflammatories (NSAID): Secondary | ICD-10-CM | POA: Diagnosis not present

## 2024-02-20 DIAGNOSIS — K219 Gastro-esophageal reflux disease without esophagitis: Secondary | ICD-10-CM | POA: Insufficient documentation

## 2024-02-20 DIAGNOSIS — M65932 Unspecified synovitis and tenosynovitis, left forearm: Secondary | ICD-10-CM

## 2024-02-20 DIAGNOSIS — Z79899 Other long term (current) drug therapy: Secondary | ICD-10-CM | POA: Insufficient documentation

## 2024-02-20 DIAGNOSIS — J45909 Unspecified asthma, uncomplicated: Secondary | ICD-10-CM | POA: Insufficient documentation

## 2024-02-20 HISTORY — DX: Gastro-esophageal reflux disease without esophagitis: K21.9

## 2024-02-20 HISTORY — PX: SYNOVECTOMY: SHX5180

## 2024-02-20 HISTORY — DX: Unspecified asthma, uncomplicated: J45.909

## 2024-02-20 HISTORY — PX: ARTHROSCOPY, WRIST WITH DEBRIDEMENT: SHX7468

## 2024-02-20 SURGERY — ARTHROSCOPY, WRIST WITH DEBRIDEMENT
Anesthesia: Monitor Anesthesia Care | Laterality: Left

## 2024-02-20 MED ORDER — BUPIVACAINE-EPINEPHRINE (PF) 0.5% -1:200000 IJ SOLN
INTRAMUSCULAR | Status: DC | PRN
  Administered 2024-02-20: 30 mL via PERINEURAL

## 2024-02-20 MED ORDER — LIDOCAINE 2% (20 MG/ML) 5 ML SYRINGE
INTRAMUSCULAR | Status: DC | PRN
  Administered 2024-02-20: 40 mg via INTRAVENOUS

## 2024-02-20 MED ORDER — MIDAZOLAM HCL 2 MG/2ML IJ SOLN
INTRAMUSCULAR | Status: AC
  Filled 2024-02-20: qty 2

## 2024-02-20 MED ORDER — LACTATED RINGERS IV SOLN: INTRAVENOUS | Status: DC

## 2024-02-20 MED ORDER — DEXMEDETOMIDINE HCL IN NACL 80 MCG/20ML IV SOLN
INTRAVENOUS | Status: AC
Start: 2024-02-20 — End: 2024-02-20
  Filled 2024-02-20: qty 20

## 2024-02-20 MED ORDER — FENTANYL CITRATE (PF) 100 MCG/2ML IJ SOLN
INTRAMUSCULAR | Status: AC
  Filled 2024-02-20: qty 2

## 2024-02-20 MED ORDER — MIDAZOLAM HCL (PF) 2 MG/2ML IJ SOLN
2.0000 mg | Freq: Once | INTRAMUSCULAR | Status: AC
  Administered 2024-02-20: 2 mg via INTRAVENOUS

## 2024-02-20 MED ORDER — LIDOCAINE 2% (20 MG/ML) 5 ML SYRINGE
INTRAMUSCULAR | Status: AC
Start: 2024-02-20 — End: 2024-02-20
  Filled 2024-02-20: qty 5

## 2024-02-20 MED ORDER — PROPOFOL 500 MG/50ML IV EMUL
INTRAVENOUS | Status: AC
Start: 2024-02-20 — End: 2024-02-20
  Filled 2024-02-20: qty 100

## 2024-02-20 MED ORDER — PROPOFOL 10 MG/ML IV BOLUS
INTRAVENOUS | Status: DC | PRN
  Administered 2024-02-20: 40 mg via INTRAVENOUS

## 2024-02-20 MED ORDER — SODIUM CHLORIDE 0.9 % IR SOLN
Status: DC | PRN
  Administered 2024-02-20: 3000 mL

## 2024-02-20 MED ORDER — LACTATED RINGERS IV SOLN: INTRAVENOUS | Status: DC | PRN

## 2024-02-20 MED ORDER — OXYCODONE HCL 5 MG PO TABS: 5.0000 mg | ORAL_TABLET | Freq: Four times a day (QID) | ORAL | 0 refills | Status: AC | PRN

## 2024-02-20 MED ORDER — ACETAMINOPHEN 10 MG/ML IV SOLN
INTRAVENOUS | Status: AC
Start: 2024-02-20 — End: 2024-02-20
  Filled 2024-02-20: qty 100

## 2024-02-20 MED ORDER — CEFAZOLIN SODIUM-DEXTROSE 2-4 GM/100ML-% IV SOLN
INTRAVENOUS | Status: AC
Start: 2024-02-20 — End: 2024-02-20
  Filled 2024-02-20: qty 100

## 2024-02-20 MED ORDER — FENTANYL CITRATE (PF) 100 MCG/2ML IJ SOLN
100.0000 ug | Freq: Once | INTRAMUSCULAR | Status: AC
  Administered 2024-02-20: 100 ug via INTRAVENOUS

## 2024-02-20 MED ORDER — ACETAMINOPHEN 10 MG/ML IV SOLN
INTRAVENOUS | Status: DC | PRN
  Administered 2024-02-20: 1000 mg via INTRAVENOUS

## 2024-02-20 MED ORDER — KETOROLAC TROMETHAMINE 30 MG/ML IJ SOLN
INTRAMUSCULAR | Status: AC
  Filled 2024-02-20: qty 1

## 2024-02-20 MED ORDER — PROPOFOL 500 MG/50ML IV EMUL
INTRAVENOUS | Status: DC | PRN
  Administered 2024-02-20: 150 ug/kg/min via INTRAVENOUS

## 2024-02-20 MED ORDER — DEXMEDETOMIDINE HCL IN NACL 80 MCG/20ML IV SOLN
INTRAVENOUS | Status: DC | PRN
  Administered 2024-02-20: 12 ug via INTRAVENOUS

## 2024-02-20 MED ORDER — CEFAZOLIN SODIUM-DEXTROSE 2-4 GM/100ML-% IV SOLN
2.0000 g | INTRAVENOUS | Status: AC
  Administered 2024-02-20: 2 g via INTRAVENOUS

## 2024-02-20 MED ORDER — MIDAZOLAM HCL 5 MG/5ML IJ SOLN
INTRAMUSCULAR | Status: DC | PRN
  Administered 2024-02-20: 2 mg via INTRAVENOUS

## 2024-02-20 MED ORDER — 0.9 % SODIUM CHLORIDE (POUR BTL) OPTIME: TOPICAL | Status: DC | PRN

## 2024-02-20 MED ORDER — ONDANSETRON HCL 4 MG/2ML IJ SOLN
INTRAMUSCULAR | Status: AC
  Filled 2024-02-20: qty 2

## 2024-02-20 MED ORDER — FENTANYL CITRATE (PF) 100 MCG/2ML IJ SOLN
INTRAMUSCULAR | Status: DC | PRN
  Administered 2024-02-20: 50 ug via INTRAVENOUS

## 2024-02-20 SURGICAL SUPPLY — 49 items
BNDG COHESIVE 4X5 WHT NS (GAUZE/BANDAGES/DRESSINGS) IMPLANT
BNDG COMPR ESMARK 4X3 LF (GAUZE/BANDAGES/DRESSINGS) ×1 IMPLANT
BNDG GAUZE DERMACEA FLUFF 4 (GAUZE/BANDAGES/DRESSINGS) ×1 IMPLANT
BURR SM HUB 3.0 OVAL (BLADE) IMPLANT
CANISTER SUCT 1200ML W/VALVE (MISCELLANEOUS) IMPLANT
CHLORAPREP W/TINT 26 (MISCELLANEOUS) ×1 IMPLANT
CORD BIPOLAR FORCEPS 12FT (ELECTRODE) IMPLANT
COVER BACK TABLE 60X90IN (DRAPES) ×1 IMPLANT
CUFF TOURN SGL QUICK 18X4 (TOURNIQUET CUFF) IMPLANT
DRAPE EXTREMITY T 121X128X90 (DISPOSABLE) ×1 IMPLANT
DRAPE OEC MINIVIEW 54X84 (DRAPES) IMPLANT
DRAPE SURG 17X23 STRL (DRAPES) ×1 IMPLANT
GAUZE SPONGE 4X4 12PLY STRL (GAUZE/BANDAGES/DRESSINGS) IMPLANT
GAUZE XEROFORM 1X8 LF (GAUZE/BANDAGES/DRESSINGS) ×1 IMPLANT
GLOVE BIO SURGEON STRL SZ7 (GLOVE) ×1 IMPLANT
GLOVE BIOGEL PI IND STRL 7.0 (GLOVE) ×1 IMPLANT
GOWN STRL REUS W/ TWL LRG LVL3 (GOWN DISPOSABLE) ×2 IMPLANT
MANIFOLD NEPTUNE II (INSTRUMENTS) ×1 IMPLANT
NDL HYPO 22X1.5 SAFETY MO (MISCELLANEOUS) IMPLANT
NDL SAFETY ECLIPSE 18X1.5 (NEEDLE) ×1 IMPLANT
PACK BASIN DAY SURGERY FS (CUSTOM PROCEDURE TRAY) ×1 IMPLANT
PAD CAST 3X4 CTTN HI CHSV (CAST SUPPLIES) ×1 IMPLANT
PADDING CAST ABS COTTON 3X4 (CAST SUPPLIES) ×1 IMPLANT
PADDING CAST ABS COTTON 4X4 ST (CAST SUPPLIES) ×1 IMPLANT
SET SM JOINT TUBING/CANN (CANNULA) ×1 IMPLANT
SHAVER DISSECTOR 3.0 (BURR) IMPLANT
SHAVER SABRE 2.0 (BURR) IMPLANT
SHEET MEDIUM DRAPE 40X70 STRL (DRAPES) ×1 IMPLANT
SLEEVE SCD COMPRESS KNEE MED (STOCKING) ×1 IMPLANT
SLING ARM FOAM STRAP LRG (SOFTGOODS) IMPLANT
SOL .9 NS 3000ML IRR UROMATIC (IV SOLUTION) ×1 IMPLANT
SOLN 0.9% NACL POUR BTL 1000ML (IV SOLUTION) IMPLANT
SPLINT FIBERGLASS 4X30 (CAST SUPPLIES) ×1 IMPLANT
SPLINT PLASTER CAST XFAST 3X15 (CAST SUPPLIES) IMPLANT
STRAP SET WRIST TOWER ACUMED (MISCELLANEOUS) ×1 IMPLANT
SUCTION TUBE FRAZIER 10FR DISP (SUCTIONS) IMPLANT
SUT ETHILON 4 0 PS 2 18 (SUTURE) ×1 IMPLANT
SUT MERSILENE 4 0 P 3 (SUTURE) IMPLANT
SUT PDS AB 2-0 CT2 27 (SUTURE) IMPLANT
SUT VIC AB 2-0 PS2 27 (SUTURE) IMPLANT
SUT VIC AB 4-0 PS2 18 (SUTURE) IMPLANT
SYR BULB EAR ULCER 3OZ GRN STR (SYRINGE) ×1 IMPLANT
SYR CONTROL 10ML LL (SYRINGE) IMPLANT
TOWEL GREEN STERILE FF (TOWEL DISPOSABLE) ×2 IMPLANT
TUBE CONNECTING 20X1/4 (TUBING) ×1 IMPLANT
TUBING ARTHROSCOPY IRRIG 16FT (MISCELLANEOUS) IMPLANT
UNDERPAD 30X36 HEAVY ABSORB (UNDERPADS AND DIAPERS) ×1 IMPLANT
WAND 1.5 MICROBLATOR (SURGICAL WAND) IMPLANT
WAND APOLLORF SJ50 AR-9845 (SURGICAL WAND) IMPLANT

## 2024-02-20 NOTE — Anesthesia Preprocedure Evaluation (Signed)
 Anesthesia Evaluation  Patient identified by MRN, date of birth, ID band Patient awake    Reviewed: Allergy  & Precautions, NPO status , Patient's Chart, lab work & pertinent test results, reviewed documented beta blocker date and time   History of Anesthesia Complications Negative for: history of anesthetic complications  Airway Mallampati: III  TM Distance: >3 FB   Mouth opening: Limited Mouth Opening  Dental no notable dental hx.    Pulmonary neg shortness of breath, asthma , neg COPD   breath sounds clear to auscultation       Cardiovascular Exercise Tolerance: Good negative cardio ROS  Rhythm:Regular Rate:Normal     Neuro/Psych neg Seizures PSYCHIATRIC DISORDERS Anxiety Depression       GI/Hepatic ,GERD  ,,(+) neg Cirrhosis        Endo/Other    Renal/GU Renal disease     Musculoskeletal   Abdominal   Peds  Hematology   Anesthesia Other Findings   Reproductive/Obstetrics                              Anesthesia Physical Anesthesia Plan  ASA: 2  Anesthesia Plan: Regional and MAC   Post-op Pain Management: Regional block*   Induction: Intravenous  PONV Risk Score and Plan: 1 and Ondansetron  and Propofol  infusion  Airway Management Planned: Natural Airway and Simple Face Mask  Additional Equipment:   Intra-op Plan:   Post-operative Plan:   Informed Consent: I have reviewed the patients History and Physical, chart, labs and discussed the procedure including the risks, benefits and alternatives for the proposed anesthesia with the patient or authorized representative who has indicated his/her understanding and acceptance.     Dental advisory given  Plan Discussed with: CRNA  Anesthesia Plan Comments:         Anesthesia Quick Evaluation

## 2024-02-20 NOTE — Interval H&P Note (Signed)
 History and Physical Interval Note:  02/20/2024 7:33 AM  Craig Pearson  has presented today for surgery, with the diagnosis of Left wrist synovitis, scapholunate ligament sprain.  The various methods of treatment have been discussed with the patient and family. After consideration of risks, benefits and other options for treatment, the patient has consented to  Procedure(s) with comments: ARTHROSCOPY, WRIST WITH DEBRIDEMENT (Left) - Left wrist arthroscopy with possible scapholunate ligament debridement and synovectomy SYNOVECTOMY (Left) - Left wrist arthroscopy with possible scapholunate ligament debridement and synovectomy as a surgical intervention.  The patient's history has been reviewed, patient examined, no change in status, stable for surgery.  I have reviewed the patient's chart and labs.  Questions were answered to the patient's satisfaction.     Erland Vivas

## 2024-02-20 NOTE — Anesthesia Procedure Notes (Signed)
 Procedure Name: MAC Date/Time: 02/20/2024 9:56 AM  Performed by: Denton Niels CROME, CRNAPre-anesthesia Checklist: Patient identified, Emergency Drugs available, Suction available, Patient being monitored and Timeout performed Oxygen Delivery Method: Simple face mask Airway Equipment and Method: Oral airway Placement Confirmation: positive ETCO2 Dental Injury: Teeth and Oropharynx as per pre-operative assessment

## 2024-02-20 NOTE — Discharge Instructions (Addendum)
 Craig Pearson, M.D. Hand Surgery  POST-OPERATIVE DISCHARGE INSTRUCTIONS   PRESCRIPTIONS: You may have been given a prescription to be taken as directed for post-operative pain control.  You may also take over the counter ibuprofen /aleve  and tylenol  for pain. Take this as directed on the packaging. Do not exceed 3000 mg tylenol /acetaminophen  in 24 hours.  Ibuprofen  600-800 mg (3-4) tablets by mouth every 6 hours as needed for pain.  OR Aleve  2 tablets by mouth every 12 hours (twice daily) as needed for pain.  AND/OR Tylenol  1000 mg (2 tablets) every 8 hours as needed for pain.  Please use your pain medication carefully, as refills are limited and you may not be provided with one.  As stated above, please use over the counter pain medicine - it will also be helpful with decreasing your swelling.    ANESTHESIA: After your surgery, post-surgical discomfort or pain is likely. This discomfort can last several days to a few weeks. At certain times of the day your discomfort may be more intense.   Did you receive a nerve block?  A nerve block can provide pain relief for one hour to two days after your surgery. As long as the nerve block is working, you will experience little or no sensation in the area the surgeon operated on.  As the nerve block wears off, you will begin to experience pain or discomfort. It is very important that you begin taking your prescribed pain medication before the nerve block fully wears off. Treating your pain at the first sign of the block wearing off will ensure your pain is better controlled and more tolerable when full-sensation returns. Do not wait until the pain is intolerable, as the medicine will be less effective. It is better to treat pain in advance than to try and catch up.   General Anesthesia:  If you did not receive a nerve block during your surgery, you will need to start taking your pain medication shortly after your surgery and should continue  to do so as prescribed by your surgeon.     ICE AND ELEVATION: You may use ice for the first 48-72 hours, but it is not critical.   Motion of your fingers is very important to decrease the swelling.  Elevation, as much as possible for the next 48 hours, is critical for decreasing swelling as well as for pain relief. Elevation means when you are seated or lying down, you hand should be at or above your heart. When walking, the hand needs to be at or above the level of your elbow.  If the bandage gets too tight, it may need to be loosened. Please contact our office and we will instruct you in how to do this.    SURGICAL BANDAGES:  Keep your dressing and/or splint clean and dry at all times.  Do not remove until you are seen again in the office.  If careful, you may place a plastic bag over your bandage and tape the end to shower, but be careful, do not get your bandages wet.     HAND THERAPY:  You may not need any. If you do, we will begin this at your follow up visit in the clinic.    ACTIVITY AND WORK: You are encouraged to move any fingers which are not in the bandage.  Light use of the fingers is allowed to assist the other hand with daily hygiene and eating, but strong gripping or lifting is often uncomfortable and  should be avoided.  You might miss a variable period of time from work and hopefully this issue has been discussed prior to surgery. You may not do any heavy work with your affected hand for about 2 weeks.    [facility] Second Floor, [address] [place], [place] [address] [phone]    Post Anesthesia Home Care Instructions  Activity: Get plenty of rest for the remainder of the day. A responsible individual must stay with you for 24 hours following the procedure.  For the next 24 hours, DO NOT: -Drive a car -Advertising copywriter -Drink alcoholic beverages -Take any medication unless instructed by your physician -Make any legal decisions or sign  important papers.  Meals: Start with liquid foods such as gelatin or soup. Progress to regular foods as tolerated. Avoid greasy, spicy, heavy foods. If nausea and/or vomiting occur, drink only clear liquids until the nausea and/or vomiting subsides. Call your physician if vomiting continues.  Special Instructions/Symptoms: Your throat may feel dry or sore from the anesthesia or the breathing tube placed in your throat during surgery. If this causes discomfort, gargle with warm salt water. The discomfort should disappear within 24 hours.  If you had a scopolamine  patch placed behind your ear for the management of post- operative nausea and/or vomiting:  1. The medication in the patch is effective for 72 hours, after which it should be removed.  Wrap patch in a tissue and discard in the trash. Wash hands thoroughly with soap and water. 2. You may remove the patch earlier than 72 hours if you experience unpleasant side effects which may include dry mouth, dizziness or visual disturbances. 3. Avoid touching the patch. Wash your hands with soap and water after contact with the patch.     Regional Anesthesia Blocks  1. You may not be able to move or feel the blocked extremity after a regional anesthetic block. This may last may last from 3-48 hours after placement, but it will go away. The length of time depends on the medication injected and your individual response to the medication. As the nerves start to wake up, you may experience tingling as the movement and feeling returns to your extremity. If the numbness and inability to move your extremity has not gone away after 48 hours, please call your surgeon.   2. The extremity that is blocked will need to be protected until the numbness is gone and the strength has returned. Because you cannot feel it, you will need to take extra care to avoid injury. Because it may be weak, you may have difficulty moving it or using it. You may not know what  position it is in without looking at it while the block is in effect.  3. For blocks in the legs and feet, returning to weight bearing and walking needs to be done carefully. You will need to wait until the numbness is entirely gone and the strength has returned. You should be able to move your leg and foot normally before you try and bear weight or walk. You will need someone to be with you when you first try to ensure you do not fall and possibly risk injury.  4. Bruising and tenderness at the needle site are common side effects and will resolve in a few days.  5. Persistent numbness or new problems with movement should be communicated to the surgeon or the [facility] [phone] [facility] [phone]).  No tylenol  until after 4pm  today.

## 2024-02-20 NOTE — Progress Notes (Signed)
 Assisted Dr. Keneth with left, supraclavicular, ultrasound guided block. Side rails up, monitors on throughout procedure. See vital signs in flow sheet. Tolerated Procedure well.

## 2024-02-20 NOTE — Transfer of Care (Signed)
 Immediate Anesthesia Transfer of Care Note  Patient: Craig Pearson  Procedure(s) Performed: ARTHROSCOPY, WRIST WITH DEBRIDEMENT (Left) SYNOVECTOMY (Left)  Patient Location: PACU  Anesthesia Type:MAC combined with regional for post-op pain  Level of Consciousness: drowsy  Airway & Oxygen Therapy: Patient Spontanous Breathing and Patient connected to face mask oxygen  Post-op Assessment: Report given to RN and Post -op Vital signs reviewed and stable  Post vital signs: Reviewed and stable  Last Vitals:  Vitals Value Taken Time  BP 108/69 (71)   Temp    Pulse 76 02/20/24 11:16  Resp 13 02/20/24 11:17  SpO2 96 % 02/20/24 11:16  Vitals shown include unfiled device data.  Last Pain:  Vitals:   02/20/24 0730  TempSrc: Tympanic  PainSc: 5       Patients Stated Pain Goal: 8 (02/20/24 0730)  Complications: No notable events documented.

## 2024-02-20 NOTE — Anesthesia Procedure Notes (Signed)
 Anesthesia Regional Block: Supraclavicular block   Pre-Anesthetic Checklist: , timeout performed,  Correct Patient, Correct Site, Correct Laterality,  Correct Procedure, Correct Position, site marked,  Risks and benefits discussed,  Surgical consent,  Pre-op evaluation,  At surgeon's request and post-op pain management  Laterality: Left  Prep: Maximum Sterile Barrier Precautions used, chloraprep       Needles:  Injection technique: Single-shot  Needle Type: Echogenic Needle      Needle Gauge: 20     Additional Needles:   Procedures:,,,, ultrasound used (permanent image in chart),,    Narrative:  Start time: 02/20/2024 7:45 AM End time: 02/20/2024 7:50 AM Injection made incrementally with aspirations every 5 mL.  Performed by: Personally  Anesthesiologist: Keneth Lynwood POUR, MD

## 2024-02-20 NOTE — Op Note (Signed)
 Date of Surgery: 02/20/2024  INDICATIONS: Patient is a 23 y.o.-year-old male with chronic left wrist pain that has failed extensive non surgical management.  He presents today for left wrist arthroscopy.  Risks, benefits, and alternatives to surgery were again discussed with the patient in the preoperative area. The patient wishes to proceed with surgery.  Informed consent was signed after our discussion.   PREOPERATIVE DIAGNOSIS:  Chronic left wrist pain  POSTOPERATIVE DIAGNOSIS:  Left wrist synovitis  PROCEDURE:  Left wrist arthroscopy with capsular debridement and synovectomy   SURGEON: Carlin Galla, M.D.  ASSIST: None  ANESTHESIA:  Regional, MAC  IV FLUIDS AND URINE: See anesthesia.  ESTIMATED BLOOD LOSS: 10 mL.  IMPLANTS: * No implants in log *   DRAINS: None  COMPLICATIONS: None noted  DESCRIPTION OF PROCEDURE: The patient was met in the preoperative holding area where the surgical site was marked and the consent form was signed.  The patient was then taken to the operating room and transferred to the operating table.  All bony prominences were well padded.  A tourniquet was applied to the left upper arm.  Monitored sedation was induced.  The operative extremity was prepped and draped in the usual and sterile fashion.  The Trimano hand positioner was attached to the bed.  A formal time-out was performed to confirm that this was the correct patient, surgery, side, and site.   Following formal timeout, the limb was gently exsanguinated with an Esmarch bandage and the tourniquet inflated to 250 mmHg.  The hand positioner of the Trovato was applied.  Gentle traction was applied to the wrist.  The 3-4 portal was identified.  The 15 blade scalpel was used to incise through the skin only.  This was followed by a small hemostat to gain access to the joint.  The trocar for the scope was then inserted followed by the arthroscope.  A diagnostic arthroscopy was performed.  There was  some synovitis involving the area around the radial styloid and volar capsule.  The scapholunate ligament was intact as well as the lunotriquetral ligament.  The TFCC was examined and found to be intact.  There was no central perforation and no capsular or foveal disruption.  There was some synovitis at the dorsal and ulnar capsule.  The 6R portal was then established.  A sucker shaver was introduced.  A synovectomy of the volar and dorsal capsule was performed.  The joint was lavaged.  I then removed the arthroscope and reinserted it into the midcarpal joint.  The head of the capitate was pristine.  The SL and LT ligaments were intact.  The midcarpal joint was unremarkable.  The arthroscope was then removed.  The portals were closed using a 4-0 Vicryl Rapide in horizontal mattress fashion.  The wounds were then cleaned and dressed with Xeroform, folded Kerlix, cast padding.  The tourniquet was deflated.  The fingertips were pink and well-perfused.  The arm was then further dressed with a volar wrist splint.  The patient was reversed from sedation.  They were transferred from the operating table to the postoperative bed.  All counts were correct x 2 at the end of the procedure.  The patient was then taken to the PACU in stable condition.   POSTOPERATIVE PLAN: He will be discharged home with appropriate pain medication and discharge instructions.  A referral will be to hand therapy to begin working on range of motion and strengthening.  I will see him back in the office in  10 to 14 days for his first postop visit.  Carlin Galla, MD 11:15 AM

## 2024-02-20 NOTE — H&P (Signed)
 HAND SURGERY   HPI: Patient is a 23 y.o. male who presents with chronic left wrist pain that has failed extensive nonsurgical management.  See office notes for details.  Patient denies any changes to their medical history or new systemic symptoms today.    Past Medical History:  Diagnosis Date   Abdominal pain    Allergy     seasonal    Anxiety    Asthma    Constipation 04/18/2011   Depression    Epigastric abdominal pain 04/18/2011   GERD (gastroesophageal reflux disease)    Vision abnormalities    wears glasses   Past Surgical History:  Procedure Laterality Date   CHOLECYSTECTOMY     MASS EXCISION Right 07/26/2016   Procedure: RIGHT WRIST GANGLION EXCISION;  Surgeon: Toribio Silos, MD;  Location: [facility];  Service: Orthopedics;  Laterality: Right;   Social History   Socioeconomic History   Marital status: Married    Spouse name: Cassie   Number of children: Not on file   Years of education: Not on file   Highest education level: Not on file  Occupational History   Not on file  Tobacco Use   Smoking status: Never    Passive exposure: Yes   Smokeless tobacco: Never   Tobacco comments:    Mother smokes outside   Vaping Use   Vaping status: Never Used  Substance and Sexual Activity   Alcohol use: Yes    Comment: occ   Drug use: No   Sexual activity: Never  Other Topics Concern   Not on file  Social History Narrative   Lives with mom, dad, 2 sisters, bro   [employer]   Social Drivers of Health   Financial Resource Strain: Low Risk  (04/26/2023)   Received from [facility]   Overall Financial Resource Strain (CARDIA)    Difficulty of Paying Living Expenses: Not hard at all  Food Insecurity: No Food Insecurity (04/26/2023)   [facility]   Hunger Vital Sign    Within the past 12 months, you worried that your food would run out before you got the money to buy more.: Never true    Within the past 12  months, the food you bought just didn't last and you didn't have money to get more.: Never true  Transportation Needs: No Transportation Needs (04/26/2023)   Received from [facility] - Transportation    Lack of Transportation (Medical): No    Lack of Transportation (Non-Medical): No  Physical Activity: Insufficiently Active (04/26/2023)   Received from [facility]   Exercise Vital Sign    On average, how many days per week do you engage in moderate to strenuous exercise (like a brisk walk)?: 2 days    On average, how many minutes do you engage in exercise at this level?: 60 min  Stress: No Stress Concern Present (04/26/2023)   Received from [facility] - Occupational Stress Questionnaire    Feeling of Stress : Not at all  Social Connections: Moderately Integrated (04/26/2023)   Received from [facility]   Social Network    How would you rate your social network (family, work, friends)?: Adequate participation with social networks   Family History  Problem Relation Age of Onset   GER disease Father    Arthritis Maternal Grandmother    Diabetes Maternal Grandmother    Hearing loss Maternal Grandmother  Hypertension Maternal Grandmother    Kidney disease Maternal Grandmother    Cancer Maternal Grandfather    Arthritis Paternal Grandmother    Heart disease Paternal Grandfather    Hirschsprung's disease Neg Hx    - negative except otherwise stated in the family history section Allergies  Allergen Reactions   Morphine  Nausea And Vomiting   Prior to Admission medications   Medication Sig Start Date End Date Taking? Authorizing Provider  cetirizine  (ZYRTEC  ALLERGY ) 10 MG tablet Take 1 tablet (10 mg total) by mouth at bedtime. 01/05/22 02/14/24 Yes Joesph Shaver Scales, PA-C  ibuprofen  (ADVIL ) 600 MG tablet Take 600 mg by mouth every 6 (six) hours as needed for moderate pain (pain score 4-6).   Yes [provider]   naproxen  (NAPROSYN ) 500 MG tablet Take 1 tablet (500 mg total) by mouth 2 (two) times daily. 08/07/23  Yes Charlyn Sora, MD  ondansetron  (ZOFRAN ) 4 MG tablet Take 1 tablet (4 mg total) by mouth every 6 (six) hours. 09/15/23  Yes Countryman, Cyndee, MD  pantoprazole (PROTONIX) 20 MG tablet Take 20 mg by mouth daily.   Yes [provider]  fluticasone  (FLONASE ) 50 MCG/ACT nasal spray Place 1 spray into both nostrils daily. Begin by using 2 sprays in each nare daily for 3 to 5 days, then decrease to 1 spray in each nare daily. 01/05/22   Joesph Shaver Scales, PA-C  montelukast  (SINGULAIR ) 10 MG tablet Take 1 tablet (10 mg total) by mouth at bedtime. Patient not taking: Reported on 02/14/2024 01/05/22 04/05/22  Joesph Shaver Scales, PA-C   No results found. - Positive ROS: All other systems have been reviewed and were otherwise negative with the exception of those mentioned in the HPI and as above.  Physical Exam: General: No acute distress, resting comfortably Cardiovascular: BUE warm and well perfused, normal rate Respiratory: Normal WOB on RA Skin: Warm and dry Neurologic: Sensation intact distally Psychiatric: Patient is at baseline mood and affect  Left Upper Extremity  No swelling or discoloration.  TTP at dorsal and central aspect of wrist. Minimally TTP at ulnar wrist.  Full and painless AROM of forearm and fingers.  Limited wrist ROM secondary to pain.  SILT m/u/r distributions.  Fingers pink and well perfused.    Assessment: Chronic left wrist pain that has failed extensive conservative management.   Plan: OR today for left wrist arthroscopy with debridement and repair as needed.  We again reviewed the risks of surgery which include bleeding, infection, damage to neurovascular structures, persistent symptoms, stiffness, need for additional surgery.    Bebe Galla, M.D. [facility] 7:01 AM

## 2024-02-22 ENCOUNTER — Encounter (HOSPITAL_BASED_OUTPATIENT_CLINIC_OR_DEPARTMENT_OTHER): Payer: Self-pay | Admitting: Orthopedic Surgery

## 2024-02-25 ENCOUNTER — Encounter (HOSPITAL_BASED_OUTPATIENT_CLINIC_OR_DEPARTMENT_OTHER): Payer: Self-pay | Admitting: Orthopedic Surgery

## 2024-02-25 NOTE — Anesthesia Postprocedure Evaluation (Signed)
 Anesthesia Post Note  Patient: Craig Pearson  Procedure(s) Performed: ARTHROSCOPY, WRIST WITH DEBRIDEMENT (Left) SYNOVECTOMY (Left)     Patient location during evaluation: PACU Anesthesia Type: Regional Level of consciousness: awake and alert Pain management: pain level controlled Vital Signs Assessment: post-procedure vital signs reviewed and stable Respiratory status: spontaneous breathing, nonlabored ventilation, respiratory function stable and patient connected to nasal cannula oxygen Cardiovascular status: blood pressure returned to baseline and stable Postop Assessment: no apparent nausea or vomiting Anesthetic complications: no   No notable events documented.  Last Vitals:  Vitals:   02/20/24 1145 02/20/24 1155  BP: 115/66 112/78  Pulse: 62 72  Resp: 17 18  Temp:  (!) 36.3 C  SpO2: 96% 97%    Last Pain:  Vitals:   02/20/24 1155  TempSrc:   PainSc: 0-No pain                 Lynwood MARLA Cornea

## 2024-03-22 ENCOUNTER — Ambulatory Visit
Admission: EM | Admit: 2024-03-22 | Discharge: 2024-03-22 | Disposition: A | Discharge status: Home / Self Care | Attending: Family Medicine | Admitting: Family Medicine

## 2024-03-22 DIAGNOSIS — J069 Acute upper respiratory infection, unspecified: Secondary | ICD-10-CM

## 2024-03-22 LAB — POCT INFLUENZA A/B
Influenza A, POC: NEGATIVE
Influenza B, POC: NEGATIVE

## 2024-03-22 LAB — POCT RAPID STREP A (OFFICE): Rapid Strep A Screen: NEGATIVE

## 2024-03-22 MED ORDER — OSELTAMIVIR PHOSPHATE 75 MG PO CAPS: 75.0000 mg | ORAL_CAPSULE | Freq: Two times a day (BID) | ORAL | 0 refills | Status: AC

## 2024-03-22 NOTE — ED Triage Notes (Signed)
 Pt reports sore throat, with difficulty swallowing, fever and congestion x 2 days. Pt has been taking Nyquil has found no relief.

## 2024-03-22 NOTE — ED Provider Notes (Signed)
 " [facility]    CSN: [id number] Arrival date & time: 03/22/24  1244      History   Chief Complaint No chief complaint on file.   HPI Craig Pearson is a 23 y.o. male.   Patient presenting today with 2-day history of sore throat, fever, congestion, cough.  Denies chest pain, shortness of breath, abdominal pain, vomiting, diarrhea.  So far trying NyQuil with minimal relief.  Multiple sick contacts recently.    Past Medical History:  Diagnosis Date   Abdominal pain    Allergy     seasonal    Anxiety    Asthma    Constipation 04/18/2011   Depression    Epigastric abdominal pain 04/18/2011   GERD (gastroesophageal reflux disease)    Vision abnormalities    wears glasses    Patient Active Problem List   Diagnosis Date Noted   Encounter for routine child health examination without abnormal findings 11/16/2016    Past Surgical History:  Procedure Laterality Date   ARTHROSCOPY, WRIST WITH DEBRIDEMENT Left 02/20/2024   Procedure: ARTHROSCOPY, WRIST WITH DEBRIDEMENT;  Surgeon: Romona Harari, MD;  Location: [facility];  Service: Orthopedics;  Laterality: Left;  Left wrist arthroscopy with possible scapholunate ligament debridement and synovectomy   CHOLECYSTECTOMY     MASS EXCISION Right 07/26/2016   Procedure: RIGHT WRIST GANGLION EXCISION;  Surgeon: Toribio Silos, MD;  Location: [facility];  Service: Orthopedics;  Laterality: Right;   SYNOVECTOMY Left 02/20/2024   Procedure: SYNOVECTOMY;  Surgeon: Romona Harari, MD;  Location: [facility];  Service: Orthopedics;  Laterality: Left;  Left wrist arthroscopy with possible scapholunate ligament debridement and synovectomy       Home Medications    Prior to Admission medications  Medication Sig Start Date End Date Taking? Authorizing Provider  oseltamivir  (TAMIFLU ) 75 MG capsule Take 1 capsule (75 mg total) by mouth every 12 (twelve) hours. 03/22/24  Yes Stuart Vernell Norris, PA-C  cetirizine  (ZYRTEC  ALLERGY ) 10 MG tablet Take 1 tablet (10 mg total) by mouth at bedtime. 01/05/22 02/14/24  Joesph Shaver Scales, PA-C  fluticasone  (FLONASE ) 50 MCG/ACT nasal spray Place 1 spray into both nostrils daily. Begin by using 2 sprays in each nare daily for 3 to 5 days, then decrease to 1 spray in each nare daily. 01/05/22   Joesph Shaver Scales, PA-C  ibuprofen  (ADVIL ) 600 MG tablet Take 600 mg by mouth every 6 (six) hours as needed for moderate pain (pain score 4-6).    [provider]  montelukast  (SINGULAIR ) 10 MG tablet Take 1 tablet (10 mg total) by mouth at bedtime. Patient not taking: Reported on 02/14/2024 01/05/22 04/05/22  Joesph Shaver Scales, PA-C  ondansetron  (ZOFRAN ) 4 MG tablet Take 1 tablet (4 mg total) by mouth every 6 (six) hours. 09/15/23   Jerral Meth, MD  pantoprazole (PROTONIX) 20 MG tablet Take 20 mg by mouth daily.    [provider]    Family History Family History  Problem Relation Age of Onset   GER disease Father    Arthritis Maternal Grandmother    Diabetes Maternal Grandmother    Hearing loss Maternal Grandmother    Hypertension Maternal Grandmother    Kidney disease Maternal Grandmother    Cancer Maternal Grandfather    Arthritis Paternal Grandmother    Heart disease Paternal Grandfather    Hirschsprung's disease Neg Hx     Social History Social History[1]   Allergies   Morphine    Review  of Systems Review of Systems PER HPI  Physical Exam Triage Vital Signs ED Triage Vitals  Encounter Vitals Group     BP 03/22/24 1427 135/82     Girls Systolic BP Percentile --      Girls Diastolic BP Percentile --      Boys Systolic BP Percentile --      Boys Diastolic BP Percentile --      Pulse Rate 03/22/24 1427 67     Resp 03/22/24 1427 20     Temp 03/22/24 1427 98.7 F (37.1 C)     Temp Source 03/22/24 1427 Oral     SpO2 03/22/24 1427 96 %     Weight --      Height --      Head  Circumference --      Peak Flow --      Pain Score 03/22/24 1430 6     Pain Loc --      Pain Education --      Exclude from Growth Chart --    No data found.  Updated Vital Signs BP 135/82 (BP Location: Right Arm)   Pulse 67   Temp 98.7 F (37.1 C) (Oral)   Resp 20   SpO2 96%   Visual Acuity Right Eye Distance:   Left Eye Distance:   Bilateral Distance:    Right Eye Near:   Left Eye Near:    Bilateral Near:     Physical Exam Vitals and nursing note reviewed.  Constitutional:      Appearance: He is well-developed.  HENT:     Head: Atraumatic.     Right Ear: External ear normal.     Left Ear: External ear normal.     Nose: Rhinorrhea present.     Mouth/Throat:     Pharynx: Posterior oropharyngeal erythema present. No oropharyngeal exudate.  Eyes:     Conjunctiva/sclera: Conjunctivae normal.     Pupils: Pupils are equal, round, and reactive to light.  Cardiovascular:     Rate and Rhythm: Normal rate and regular rhythm.     Heart sounds: Normal heart sounds.  Pulmonary:     Effort: Pulmonary effort is normal. No respiratory distress.     Breath sounds: No wheezing or rales.  Musculoskeletal:        General: Normal range of motion.     Cervical back: Normal range of motion and neck supple.  Lymphadenopathy:     Cervical: No cervical adenopathy.  Skin:    General: Skin is warm and dry.  Neurological:     Mental Status: He is alert and oriented to person, place, and time.  Psychiatric:        Behavior: Behavior normal.      UC Treatments / Results  Labs (all labs ordered are listed, but only abnormal results are displayed) Labs Reviewed  POCT INFLUENZA A/B  POCT RAPID STREP A (OFFICE)    EKG   Radiology No results found.  Procedures Procedures (including critical care time)  Medications Ordered in UC Medications - No data to display  Initial Impression / Assessment and Plan / UC Course  I have reviewed the triage vital signs and the nursing  notes.  Pertinent labs & imaging results that were available during my care of the patient were reviewed by me and considered in my medical decision making (see chart for details).     Vitals and exam reassuring today, rapid flu, rapid strep negative but given flulike symptoms and  community exposures will treat viral respiratory infection with Tamiflu , over-the-counter cold and congestion medications and supportive home care.  Return for worsening or unresolving symptoms.  Final Clinical Impressions(s) / UC Diagnoses   Final diagnoses:  Viral URI with cough   Discharge Instructions   None    ED Prescriptions     Medication Sig Dispense Auth. Provider   oseltamivir  (TAMIFLU ) 75 MG capsule Take 1 capsule (75 mg total) by mouth every 12 (twelve) hours. 10 capsule Stuart Vernell Norris, [place]      PDMP not reviewed this encounter.    [1]  Social History Tobacco Use   Smoking status: Never    Passive exposure: Yes   Smokeless tobacco: Never   Tobacco comments:    Mother smokes outside   Vaping Use   Vaping status: Never Used  Substance Use Topics   Alcohol use: Yes    Comment: occ   Drug use: No     Stuart Vernell Norris, PA-C 03/22/24 1455  "
